# Patient Record
Sex: Female | Born: 1980 | Race: White | Hispanic: No | Marital: Single | Smoking: Current every day smoker
Health system: Southern US, Community
[De-identification: ages and names within clinical notes are randomized; demographics above are authoritative.]

## PROBLEM LIST (undated history)

## (undated) ENCOUNTER — Inpatient Hospital Stay (HOSPITAL_COMMUNITY): Payer: Self-pay

## (undated) DIAGNOSIS — F329 Major depressive disorder, single episode, unspecified: Secondary | ICD-10-CM

## (undated) DIAGNOSIS — O30049 Twin pregnancy, dichorionic/diamniotic, unspecified trimester: Secondary | ICD-10-CM

## (undated) DIAGNOSIS — R87619 Unspecified abnormal cytological findings in specimens from cervix uteri: Secondary | ICD-10-CM

## (undated) DIAGNOSIS — F32A Depression, unspecified: Secondary | ICD-10-CM

## (undated) DIAGNOSIS — I10 Essential (primary) hypertension: Secondary | ICD-10-CM

## (undated) DIAGNOSIS — Z720 Tobacco use: Secondary | ICD-10-CM

## (undated) DIAGNOSIS — J449 Chronic obstructive pulmonary disease, unspecified: Secondary | ICD-10-CM

## (undated) DIAGNOSIS — F419 Anxiety disorder, unspecified: Secondary | ICD-10-CM

## (undated) DIAGNOSIS — Z8709 Personal history of other diseases of the respiratory system: Secondary | ICD-10-CM

## (undated) DIAGNOSIS — IMO0002 Reserved for concepts with insufficient information to code with codable children: Secondary | ICD-10-CM

## (undated) HISTORY — PX: BREAST BIOPSY: SHX20

## (undated) HISTORY — PX: CRYOTHERAPY: SHX1416

## (undated) HISTORY — PX: CHOLECYSTECTOMY: SHX55

## (undated) HISTORY — PX: CLEFT LIP REPAIR: SHX5225

## (undated) HISTORY — PX: RHINOPLASTY FOR CLEFT LIP / PALATE: SUR1285

## (undated) HISTORY — PX: WISDOM TOOTH EXTRACTION: SHX21

## (undated) HISTORY — PX: COLPOSCOPY: SHX161

---

## 2012-08-11 NOTE — L&D Delivery Note (Signed)
Delivery Note At 6:16 AM a healthy female was delivered via Vaginal, Spontaneous Delivery (Presentation: ; Occiput Anterior).  APGAR:8 ,9 ; weight pending .   Placenta status: delivered spontaneously--looked like small infarcts, sent to pathology, .  Cord: 3 vessels with the following complications: Nuchal x 1 reduced    Anesthesia: Epidural  Episiotomy: None Lacerations: small right labial Suture Repair: 3.0 vicryl in two interrupted sutures Est. Blood Loss (mL): 350cc  Mom to postpartum.  Baby to stay with mother.  Oliver Pila 10/19/2012, 6:33 AM

## 2012-10-18 ENCOUNTER — Inpatient Hospital Stay (HOSPITAL_COMMUNITY)
Admission: AD | Admit: 2012-10-18 | Discharge: 2012-10-20 | DRG: 775 | Disposition: A | Discharge status: Home / Self Care | Payer: Medicaid Other | Source: Ambulatory Visit | Attending: Obstetrics and Gynecology | Admitting: Obstetrics and Gynecology

## 2012-10-18 ENCOUNTER — Encounter (HOSPITAL_COMMUNITY): Payer: Self-pay

## 2012-10-18 DIAGNOSIS — O99334 Smoking (tobacco) complicating childbirth: Secondary | ICD-10-CM | POA: Diagnosis present

## 2012-10-18 DIAGNOSIS — O4290 Premature rupture of membranes, unspecified as to length of time between rupture and onset of labor, unspecified weeks of gestation: Secondary | ICD-10-CM

## 2012-10-18 HISTORY — DX: Anxiety disorder, unspecified: F41.9

## 2012-10-18 HISTORY — DX: Reserved for concepts with insufficient information to code with codable children: IMO0002

## 2012-10-18 HISTORY — DX: Unspecified abnormal cytological findings in specimens from cervix uteri: R87.619

## 2012-10-18 LAB — OB RESULTS CONSOLE RPR: RPR: NONREACTIVE

## 2012-10-18 LAB — COMPREHENSIVE METABOLIC PANEL
ALT: 8 U/L (ref 0–35)
Alkaline Phosphatase: 113 U/L (ref 39–117)
BUN: 5 mg/dL — ABNORMAL LOW (ref 6–23)
BUN: 5 mg/dL — ABNORMAL LOW (ref 6–23)
CO2: 21 mEq/L (ref 19–32)
CO2: 20 mEq/L (ref 19–32)
Calcium: 9.3 mg/dL (ref 8.4–10.5)
Chloride: 101 mEq/L (ref 96–112)
Creatinine, Ser: 0.73 mg/dL (ref 0.50–1.10)
GFR calc Af Amer: >90 mL/min (ref >90)
GFR calc Af Amer: >90 mL/min (ref >90)
GFR calc non Af Amer: >90 mL/min (ref >90)
GFR calc non Af Amer: >90 mL/min (ref >90)
Glucose, Bld: 78 mg/dL (ref 70–99)
Glucose, Bld: 78 mg/dL (ref 70–99)
Potassium: 3.6 mEq/L (ref 3.5–5.1)
Sodium: 134 mEq/L — ABNORMAL LOW (ref 135–145)
Sodium: 134 mEq/L — ABNORMAL LOW (ref 135–145)
Total Bilirubin: 0.2 mg/dL — ABNORMAL LOW (ref 0.3–1.2)
Total Protein: 6.8 g/dL (ref 6.0–8.3)

## 2012-10-18 LAB — LACTATE DEHYDROGENASE: LDH: 160 U/L (ref 94–250)

## 2012-10-18 LAB — OB RESULTS CONSOLE HIV ANTIBODY (ROUTINE TESTING)
HIV: NONREACTIVE
HIV: NONREACTIVE

## 2012-10-18 LAB — CBC
HCT: 32.4 % — ABNORMAL LOW (ref 36.0–46.0)
Hemoglobin: 10.9 g/dL — ABNORMAL LOW (ref 12.0–15.0)
MCV: 88.5 fL (ref 78.0–100.0)
RBC: 3.66 MIL/uL — ABNORMAL LOW (ref 3.87–5.11)
RDW: 13.3 % (ref 11.5–15.5)
WBC: 17.1 10*3/uL — ABNORMAL HIGH (ref 4.0–10.5)

## 2012-10-18 LAB — OB RESULTS CONSOLE RUBELLA ANTIBODY, IGM: Rubella: IMMUNE

## 2012-10-18 LAB — OB RESULTS CONSOLE GC/CHLAMYDIA
Chlamydia: NEGATIVE
Gonorrhea: NEGATIVE

## 2012-10-18 LAB — URIC ACID: Uric Acid, Serum: 4 mg/dL (ref 2.4–7.0)

## 2012-10-18 LAB — OB RESULTS CONSOLE ABO/RH

## 2012-10-18 LAB — OB RESULTS CONSOLE GBS: GBS: NEGATIVE

## 2012-10-18 MED ORDER — BUTORPHANOL TARTRATE 1 MG/ML IJ SOLN
1.0000 mg | INTRAMUSCULAR | Status: DC | PRN
  Administered 2012-10-18 – 2012-10-19 (×2): 1 mg via INTRAVENOUS
  Filled 2012-10-18 (×2): qty 1

## 2012-10-18 MED ORDER — CITRIC ACID-SODIUM CITRATE 334-500 MG/5ML PO SOLN: 30.0000 mL | ORAL | Status: DC | PRN

## 2012-10-18 MED ORDER — PHENYLEPHRINE 40 MCG/ML (10ML) SYRINGE FOR IV PUSH (FOR BLOOD PRESSURE SUPPORT): 80.0000 ug | PREFILLED_SYRINGE | INTRAVENOUS | Status: DC | PRN

## 2012-10-18 MED ORDER — DIPHENHYDRAMINE HCL 50 MG/ML IJ SOLN: 12.5000 mg | INTRAMUSCULAR | Status: DC | PRN

## 2012-10-18 MED ORDER — PENICILLIN G POTASSIUM 5000000 UNITS IJ SOLR
5.0000 10*6.[IU] | Freq: Once | INTRAMUSCULAR | Status: DC
  Filled 2012-10-18: qty 5

## 2012-10-18 MED ORDER — OXYCODONE-ACETAMINOPHEN 5-325 MG PO TABS: 1.0000 | ORAL_TABLET | ORAL | Status: DC | PRN

## 2012-10-18 MED ORDER — TERBUTALINE SULFATE 1 MG/ML IJ SOLN: 0.2500 mg | Freq: Once | INTRAMUSCULAR | Status: AC | PRN

## 2012-10-18 MED ORDER — OXYTOCIN 40 UNITS IN LACTATED RINGERS INFUSION - SIMPLE MED: 62.5000 mL/h | INTRAVENOUS | Status: DC

## 2012-10-18 MED ORDER — EPHEDRINE 5 MG/ML INJ: 10.0000 mg | INTRAVENOUS | Status: DC | PRN

## 2012-10-18 MED ORDER — ACETAMINOPHEN 325 MG PO TABS: 650.0000 mg | ORAL_TABLET | ORAL | Status: DC | PRN

## 2012-10-18 MED ORDER — EPHEDRINE 5 MG/ML INJ
10.0000 mg | INTRAVENOUS | Status: DC | PRN
  Filled 2012-10-18: qty 4

## 2012-10-18 MED ORDER — PHENYLEPHRINE 40 MCG/ML (10ML) SYRINGE FOR IV PUSH (FOR BLOOD PRESSURE SUPPORT)
80.0000 ug | PREFILLED_SYRINGE | INTRAVENOUS | Status: DC | PRN
  Filled 2012-10-18: qty 5

## 2012-10-18 MED ORDER — PROMETHAZINE HCL 25 MG/ML IJ SOLN
12.5000 mg | Freq: Four times a day (QID) | INTRAMUSCULAR | Status: DC | PRN
  Administered 2012-10-18: 12.5 mg via INTRAVENOUS
  Filled 2012-10-18: qty 1

## 2012-10-18 MED ORDER — IBUPROFEN 600 MG PO TABS: 600.0000 mg | ORAL_TABLET | Freq: Four times a day (QID) | ORAL | Status: DC | PRN

## 2012-10-18 MED ORDER — PENICILLIN G POTASSIUM 5000000 UNITS IJ SOLR
2.5000 10*6.[IU] | INTRAVENOUS | Status: DC
  Filled 2012-10-18 (×4): qty 2.5

## 2012-10-18 MED ORDER — LIDOCAINE HCL (PF) 1 % IJ SOLN
30.0000 mL | INTRAMUSCULAR | Status: DC | PRN
  Filled 2012-10-18: qty 30

## 2012-10-18 MED ORDER — LACTATED RINGERS IV SOLN: 500.0000 mL | INTRAVENOUS | Status: DC | PRN

## 2012-10-18 MED ORDER — LACTATED RINGERS IV SOLN
INTRAVENOUS | Status: DC
  Administered 2012-10-18 – 2012-10-19 (×3): via INTRAVENOUS

## 2012-10-18 MED ORDER — OXYTOCIN BOLUS FROM INFUSION
500.0000 mL | INTRAVENOUS | Status: DC
  Administered 2012-10-19: 500 mL via INTRAVENOUS

## 2012-10-18 MED ORDER — FENTANYL 2.5 MCG/ML BUPIVACAINE 1/10 % EPIDURAL INFUSION (WH - ANES)
14.0000 mL/h | INTRAMUSCULAR | Status: DC | PRN
  Filled 2012-10-18: qty 125

## 2012-10-18 MED ORDER — OXYTOCIN 40 UNITS IN LACTATED RINGERS INFUSION - SIMPLE MED
1.0000 m[IU]/min | INTRAVENOUS | Status: DC
  Administered 2012-10-18: 2 m[IU]/min via INTRAVENOUS
  Filled 2012-10-18: qty 1000

## 2012-10-18 MED ORDER — ONDANSETRON HCL 4 MG/2ML IJ SOLN: 4.0000 mg | Freq: Four times a day (QID) | INTRAMUSCULAR | Status: DC | PRN

## 2012-10-18 MED ORDER — LACTATED RINGERS IV SOLN
500.0000 mL | Freq: Once | INTRAVENOUS | Status: AC
  Administered 2012-10-19: 500 mL via INTRAVENOUS

## 2012-10-18 NOTE — MAU Note (Signed)
Patient states she started leaking bloody fluid at 0730. Reports good fetal movement and not sure if having contractions. Having no pain.

## 2012-10-18 NOTE — MAU Note (Signed)
Dr. Senaida Ores returned page. Notified pt r/o rupture, bloody amniotic fluid, fern +, cervix 1/70/-2, GBS not in prenatal record. Bp's elevated. Dr. Senaida Ores to put in orders for admission, etc.

## 2012-10-18 NOTE — MAU Note (Signed)
Pt states leaking of fluid began this am, denies pain or contractions. Has not had vaginal exam nor GBS swab.

## 2012-10-18 NOTE — Progress Notes (Signed)
Patient ID: Robin Valenzuela, female   DOB: 1980-09-26, 32 y.o.   MRN: [id number] Pt just starting to get a bit uncomfortable.  Coping for now FHR reassuring, no decels accels and variability present  Cervix 80-90/1/-1  Bleeding has improved quite a bit, just slightly blood tinged fluid Pt in latent phase labor, pitocin at 16mu  BP borderline 130-140/80-90  Will check PIH labs platelets WNL.

## 2012-10-18 NOTE — Progress Notes (Signed)
   Subjective: Pt feeling no pain or ctx.  +LOF concerned over bloody nature  Objective: BP 143/84  Pulse 82  Temp(Src) 97.6 F (36.4 C) (Oral)  Resp 18  Ht 5\' 8"  (1.727 m)  Wt 85.276 kg (188 lb)  BMI 28.59 kg/m2  SpO2 99%      FHT:  FHR: 135 bpm, variability: moderate,  accelerations:  Present,  decelerations:  Absent UC:   none SVE:   Dilation: Fingertip Effacement (%): 80 Station: -2 Exam by:: Dr. Senaida Ores Fluid does appear to have more blood than usual and a few clots  Labs: Lab Results  Component Value Date   WBC 17.1* 10/18/2012   HGB 10.9* 10/18/2012   HCT 32.4* 10/18/2012   MCV 88.5 10/18/2012   PLT 379 10/18/2012    Assessment / Plan: Pt with SROM and no onset of labor.  FHR currently reassuring.  D/w pt and family possible small abruption causing bleeding and that we will be watching FHR very closely to see if tolerates labor. Robin Valenzuela 10/18/2012, 1:42 PM

## 2012-10-18 NOTE — H&P (Signed)
Robin Valenzuela is a 32 y.o. female G1P0 presenting for at [redacted] weeks gestation(EDD 11/05/12 by 9 week [place]) with SROM about 0730am.  Her prenatal care is significant for a positive smoker status and a history of anxiety which is stable on Celexa.  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  The baby was S<D but an [place] 2/18 showed the baby at the 34%ile.    Maternal Medical History:  Reason for admission: Rupture of membranes.   Contractions: Onset was 3-5 hours ago.   Frequency: irregular.   Perceived severity is mild.    Fetal activity: Perceived fetal activity is normal.    Prenatal Complications - Diabetes: none.    OB History   Grav Para Term Preterm Abortions TAB SAB Ect Mult Living   1              Past Medical History  Diagnosis Date  . Abnormal Pap smear   . Anxiety    Past Surgical History  Procedure Laterality Date  . Cholecystectomy    . Rhinoplasty for cleft lip / palate    . Cryotherapy    . Colposcopy    . Wisdom tooth extraction     Family History: family history is not on file. Social History:  reports that she has been smoking Cigarettes.  She has been smoking about 0.50 packs per day. She has never used smokeless tobacco. She reports that she does not drink alcohol or use illicit drugs.   Prenatal Transfer Tool  Maternal Diabetes: No Genetic Screening: Normal Maternal Ultrasounds/Referrals: Normal Fetal Ultrasounds or other Referrals:  None Maternal Substance Abuse:  Yes:  Type: Smoker Significant Maternal Medications:  Meds include: Other: Celexa Significant Maternal Lab Results:  Lab values include: Other: GBS unknown on admission Other Comments:  None  ROS  Dilation: 1 Effacement (%): 70 Station: -2 Exam by:: SBeck, RN Blood pressure 146/88, pulse 88, temperature 98.7 F (37.1 C), temperature source Oral, resp. rate 18, height 5\' 8"  (1.727 m), weight 85.367 kg (188 lb 3.2 oz), SpO2 99.00%. Maternal Exam:  Uterine Assessment:  Contraction strength is mild.  Contraction frequency is irregular.   Abdomen: Patient reports no abdominal tenderness. Fetal presentation: vertex  Introitus: Normal vulva. Normal vagina.  Ferning test: positive.  Amniotic fluid character: bloody.  Pelvis: adequate for delivery.      Physical Exam  Constitutional: She is oriented to person, place, and time. She appears well-developed and well-nourished.  Cardiovascular: Normal rate and regular rhythm.   Respiratory: Effort normal and breath sounds normal.  GI: Soft.  Neurological: She is alert and oriented to person, place, and time.  Psychiatric: She has a normal mood and affect. Her behavior is normal.    Prenatal labs: ABO, Rh: A/Positive/-- (03/10 0000) Antibody: Negative (03/10 0000) Rubella: Immune (03/10 0000) RPR: Nonreactive (03/10 0000)  HBsAg: Negative (03/10 0000)  HIV: Non-reactive (03/10 0000)  GBS:   unknown--missed appt to have done First trimester screen and AFP WNL One hour GTT 95  Assessment/Plan: Pt admitted with SROM and no real labor.  Will admit and augment with pitocin.  GBS status unknown as pt missed her appt last week.  We will send a rapid GBS cx and treat for now.   Oliver Pila 10/18/2012, 12:29 PM

## 2012-10-19 ENCOUNTER — Encounter (HOSPITAL_COMMUNITY): Payer: Self-pay | Admitting: Anesthesiology

## 2012-10-19 ENCOUNTER — Encounter (HOSPITAL_COMMUNITY): Payer: Self-pay | Admitting: Family Medicine

## 2012-10-19 DIAGNOSIS — O4290 Premature rupture of membranes, unspecified as to length of time between rupture and onset of labor, unspecified weeks of gestation: Secondary | ICD-10-CM

## 2012-10-19 MED ORDER — IBUPROFEN 600 MG PO TABS
600.0000 mg | ORAL_TABLET | Freq: Four times a day (QID) | ORAL | Status: DC
  Administered 2012-10-19 – 2012-10-20 (×4): 600 mg via ORAL
  Filled 2012-10-19 (×6): qty 1

## 2012-10-19 MED ORDER — DIBUCAINE 1 % RE OINT: 1.0000 "application " | TOPICAL_OINTMENT | RECTAL | Status: DC | PRN

## 2012-10-19 MED ORDER — ZOLPIDEM TARTRATE 5 MG PO TABS: 5.0000 mg | ORAL_TABLET | Freq: Every evening | ORAL | Status: DC | PRN

## 2012-10-19 MED ORDER — ONDANSETRON HCL 4 MG/2ML IJ SOLN: 4.0000 mg | INTRAMUSCULAR | Status: DC | PRN

## 2012-10-19 MED ORDER — BENZOCAINE-MENTHOL 20-0.5 % EX AERO
1.0000 "application " | INHALATION_SPRAY | CUTANEOUS | Status: DC | PRN
  Administered 2012-10-19: 1 via TOPICAL
  Filled 2012-10-19: qty 56

## 2012-10-19 MED ORDER — OXYCODONE-ACETAMINOPHEN 5-325 MG PO TABS: 1.0000 | ORAL_TABLET | ORAL | Status: DC | PRN

## 2012-10-19 MED ORDER — SIMETHICONE 80 MG PO CHEW: 80.0000 mg | CHEWABLE_TABLET | ORAL | Status: DC | PRN

## 2012-10-19 MED ORDER — LANOLIN HYDROUS EX OINT: TOPICAL_OINTMENT | CUTANEOUS | Status: DC | PRN

## 2012-10-19 MED ORDER — WITCH HAZEL-GLYCERIN EX PADS: 1.0000 "application " | MEDICATED_PAD | CUTANEOUS | Status: DC | PRN

## 2012-10-19 MED ORDER — ONDANSETRON HCL 4 MG PO TABS: 4.0000 mg | ORAL_TABLET | ORAL | Status: DC | PRN

## 2012-10-19 MED ORDER — PRENATAL MULTIVITAMIN CH
1.0000 | ORAL_TABLET | Freq: Every day | ORAL | Status: DC
  Administered 2012-10-19: 1 via ORAL
  Filled 2012-10-19: qty 1

## 2012-10-19 MED ORDER — DIPHENHYDRAMINE HCL 25 MG PO CAPS: 25.0000 mg | ORAL_CAPSULE | Freq: Four times a day (QID) | ORAL | Status: DC | PRN

## 2012-10-19 MED ORDER — SENNOSIDES-DOCUSATE SODIUM 8.6-50 MG PO TABS
2.0000 | ORAL_TABLET | Freq: Every day | ORAL | Status: DC
  Administered 2012-10-19: 2 via ORAL

## 2012-10-19 MED ORDER — TETANUS-DIPHTH-ACELL PERTUSSIS 5-2.5-18.5 LF-MCG/0.5 IM SUSP: 0.5000 mL | Freq: Once | INTRAMUSCULAR | Status: DC

## 2012-10-19 NOTE — Progress Notes (Signed)
Post Partum Day 0 Subjective: no complaints, up ad lib and tolerating PO  Objective: Blood pressure 154/78, pulse 82, temperature 98.9 F (37.2 C), temperature source Oral, resp. rate 18, height 5\' 8"  (1.727 m), weight 85.276 kg (188 lb), SpO2 98.00%, unknown if currently breastfeeding.  Physical Exam:  General: alert and cooperative Lochia: appropriate Uterine Fundus: firm    Recent Labs  10/18/12 1245  HGB 10.9*  HCT 32.4*    Assessment/Plan: Breastfeeding Pt BP still elevated.  PIH labs WNL in labor.  Will follow BP to see if normalizes throughout day.   LOS: 1 day   RICHARDSON,KATHY W 10/19/2012, 8:52 AM

## 2012-10-19 NOTE — Discharge Summary (Signed)
Obstetric Discharge Summary Reason for Admission: rupture of membranes Prenatal Procedures: none Intrapartum Procedures: spontaneous vaginal delivery Postpartum Procedures: none Complications-Operative and Postpartum: small right labial laceration Hemoglobin  Date Value Range Status  10/20/2012 8.4* 12.0 - 15.0 g/dL Final     DELTA CHECK NOTED     REPEATED TO VERIFY     HCT  Date Value Range Status  10/20/2012 24.9* 36.0 - 46.0 % Final    Physical Exam:  General: alert and cooperative Lochia: appropriate Uterine Fundus: firm  Discharge Diagnoses: Term Pregnancy-delivered  Discharge Information: Date: 10/20/2012 Activity: pelvic rest Diet: routine Medications: Ibuprofen and Percocet Condition: improved Instructions: refer to practice specific booklet Discharge to: home Follow-up Information   Follow up with Oliver Pila, MD. Schedule an appointment as soon as possible for a visit in 6 weeks.   Contact information:   [address], [address] [place] [address] [phone]       Newborn Data: Live born female  Birth Weight: 6 lb 0.5 oz (2735 g) APGAR: 8, 9  Home with mother.  Oliver Pila 10/20/2012, 8:28 AM

## 2012-10-19 NOTE — Anesthesia Preprocedure Evaluation (Signed)

## 2012-10-19 NOTE — Progress Notes (Signed)
Epidural catheter pulled at 07:12 per TO by Dr. Cristela Blue. Catheter intact.

## 2012-10-19 NOTE — Anesthesia Postprocedure Evaluation (Signed)
  Anesthesia Post-op Note  Patient: Robin Valenzuela  Procedure(s) Performed: * No procedures listed *  Patient Location: Mother/Baby  Anesthesia Type:Epidural  Level of Consciousness: awake, alert , oriented and patient cooperative  Airway and Oxygen Therapy: Patient Spontanous Breathing  Post-op Pain: mild  Post-op Assessment: Patient's Cardiovascular Status Stable, Respiratory Function Stable, Patent Airway, No signs of Nausea or vomiting, Adequate PO intake and Pain level controlled  Post-op Vital Signs: Reviewed and stable  Complications: No apparent anesthesia complications

## 2012-10-20 LAB — CBC
HCT: 24.9 % — ABNORMAL LOW (ref 36.0–46.0)
Hemoglobin: 8.4 g/dL — ABNORMAL LOW (ref 12.0–15.0)
MCH: 29.9 pg (ref 26.0–34.0)
RBC: 2.81 MIL/uL — ABNORMAL LOW (ref 3.87–5.11)

## 2012-10-20 MED ORDER — OXYCODONE-ACETAMINOPHEN 5-325 MG PO TABS: 1.0000 | ORAL_TABLET | ORAL | Status: DC | PRN

## 2012-10-20 MED ORDER — IBUPROFEN 600 MG PO TABS: 600.0000 mg | ORAL_TABLET | Freq: Four times a day (QID) | ORAL | Status: DC

## 2012-10-20 NOTE — Progress Notes (Signed)
Pt discharged before CSW could assess history of anxiety. 

## 2012-10-20 NOTE — Progress Notes (Signed)
Post Partum Day 1 Subjective: no complaints, tolerating PO and + flatus Requests early d/c  Objective: Blood pressure 153/94, pulse 74, temperature 98.3 F (36.8 C), temperature source Oral, resp. rate 18, height 5\' 8"  (1.727 m), weight 85.276 kg (188 lb), SpO2 100.00%, unknown if currently breastfeeding.  Physical Exam:  General: alert and cooperative Lochia: appropriate Uterine Fundus: firm    Recent Labs  10/18/12 1245 10/20/12 0635  HGB 10.9* 8.4*  HCT 32.4* 24.9*    Assessment/Plan: Plan for discharge tomorrow if baby able to go Motrin and percocet   LOS: 2 days   RICHARDSON,KATHY W 10/20/2012, 8:26 AM

## 2012-10-27 ENCOUNTER — Telehealth (HOSPITAL_COMMUNITY): Payer: Self-pay | Admitting: *Deleted

## 2012-10-27 NOTE — Telephone Encounter (Signed)
Resolve episode 

## 2012-11-22 ENCOUNTER — Inpatient Hospital Stay (HOSPITAL_COMMUNITY): Admission: AD | Admit: 2012-11-22 | Payer: Self-pay | Source: Ambulatory Visit | Admitting: Obstetrics and Gynecology

## 2014-06-12 ENCOUNTER — Encounter (HOSPITAL_COMMUNITY): Payer: Self-pay | Admitting: Family Medicine

## 2015-03-12 DIAGNOSIS — O30049 Twin pregnancy, dichorionic/diamniotic, unspecified trimester: Secondary | ICD-10-CM

## 2015-03-12 HISTORY — DX: Twin pregnancy, dichorionic/diamniotic, unspecified trimester: O30.049

## 2015-04-04 LAB — OB RESULTS CONSOLE GC/CHLAMYDIA
CHLAMYDIA, DNA PROBE: NEGATIVE
Gonorrhea: NEGATIVE

## 2015-04-06 ENCOUNTER — Encounter (HOSPITAL_COMMUNITY): Payer: Self-pay | Admitting: Obstetrics and Gynecology

## 2015-04-06 ENCOUNTER — Other Ambulatory Visit (HOSPITAL_COMMUNITY): Payer: Self-pay | Admitting: Obstetrics and Gynecology

## 2015-04-06 DIAGNOSIS — O358XX2 Maternal care for other (suspected) fetal abnormality and damage, fetus 2: Secondary | ICD-10-CM

## 2015-04-06 DIAGNOSIS — Z3A13 13 weeks gestation of pregnancy: Secondary | ICD-10-CM

## 2015-04-06 DIAGNOSIS — IMO0001 Reserved for inherently not codable concepts without codable children: Secondary | ICD-10-CM

## 2015-04-06 DIAGNOSIS — Z3682 Encounter for antenatal screening for nuchal translucency: Secondary | ICD-10-CM

## 2015-04-06 DIAGNOSIS — O30049 Twin pregnancy, dichorionic/diamniotic, unspecified trimester: Secondary | ICD-10-CM

## 2015-04-06 DIAGNOSIS — O358XX1 Maternal care for other (suspected) fetal abnormality and damage, fetus 1: Secondary | ICD-10-CM

## 2015-04-13 ENCOUNTER — Encounter (HOSPITAL_COMMUNITY): Payer: Self-pay

## 2015-04-13 ENCOUNTER — Ambulatory Visit (HOSPITAL_COMMUNITY)
Admission: RE | Admit: 2015-04-13 | Discharge: 2015-04-13 | Disposition: A | Discharge status: Home / Self Care | Payer: Medicaid Other | Source: Ambulatory Visit | Attending: Obstetrics and Gynecology | Admitting: Obstetrics and Gynecology

## 2015-04-13 ENCOUNTER — Other Ambulatory Visit (HOSPITAL_COMMUNITY): Payer: Self-pay | Admitting: Obstetrics and Gynecology

## 2015-04-13 DIAGNOSIS — O358XX2 Maternal care for other (suspected) fetal abnormality and damage, fetus 2: Secondary | ICD-10-CM

## 2015-04-13 DIAGNOSIS — Z3A12 12 weeks gestation of pregnancy: Secondary | ICD-10-CM

## 2015-04-13 DIAGNOSIS — O30042 Twin pregnancy, dichorionic/diamniotic, second trimester: Secondary | ICD-10-CM

## 2015-04-13 DIAGNOSIS — O30041 Twin pregnancy, dichorionic/diamniotic, first trimester: Secondary | ICD-10-CM | POA: Diagnosis not present

## 2015-04-13 DIAGNOSIS — IMO0001 Reserved for inherently not codable concepts without codable children: Secondary | ICD-10-CM

## 2015-04-13 DIAGNOSIS — O283 Abnormal ultrasonic finding on antenatal screening of mother: Secondary | ICD-10-CM

## 2015-04-13 DIAGNOSIS — O358XX1 Maternal care for other (suspected) fetal abnormality and damage, fetus 1: Secondary | ICD-10-CM

## 2015-04-13 DIAGNOSIS — O30049 Twin pregnancy, dichorionic/diamniotic, unspecified trimester: Secondary | ICD-10-CM

## 2015-04-13 DIAGNOSIS — Z3682 Encounter for antenatal screening for nuchal translucency: Secondary | ICD-10-CM

## 2015-04-13 DIAGNOSIS — Z3A13 13 weeks gestation of pregnancy: Secondary | ICD-10-CM

## 2015-04-13 DIAGNOSIS — Z315 Encounter for genetic counseling: Secondary | ICD-10-CM | POA: Diagnosis not present

## 2015-04-13 HISTORY — DX: Twin pregnancy, dichorionic/diamniotic, unspecified trimester: O30.049

## 2015-04-13 NOTE — Progress Notes (Addendum)
Genetic Counseling  High-Risk Gestation Note  Appointment Date:  04/13/2015 Referred By: Huel Cote, MD Date of Birth:  1980/12/04   Pregnancy History: G2P1001 Estimated Date of Delivery: 10/26/15 Estimated Gestational Age: [redacted]w[redacted]d Attending: Particia Nearing, MD    Robin Valenzuela was seen for genetic counseling because of increased nuchal translucency on ultrasound in a dichorionic/diamniotic twin gestation. The patient was accompanied by her mother to today's appointment.   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 Twin B had an NT measurement of 1.9 mm, which is within normal range. Complete ultrasound results reported under separate cover.   We discussed that the fetal NT refers to a fluid filled space between the skin and soft tissues behind the cervical spine.  This space is traditionally measurable between 11 and 13.[redacted] weeks gestation and is considered enlarged at ~3 mm or greater. Robin Valenzuela was counseled regarding the various common etiologies for an enlarged NT including: aneuploidy, single gene conditions, cardiac or great vessel abnormalities, lymphatic system failure, decreased fetal movement, and fetal anemia.    We reviewed chromosomes, nondisjunction, and the common features and prognoses of Down syndrome and other aneuploidies.  Considering  Robin Valenzuela's age, an NT of 3.7 and a CRL of 52 mm, the risk for fetal aneuploidy is estimated to be approximately 25%.   In addition, we discussed that an NT of 3.7 is associated with an approximate 3% risk for a fetal cardiac anomaly.  We also discussed single gene conditions and that these conditions are not routinely tested for prenatally unless ultrasound findings or family history significantly increase the suspicion of a specific single gene disorder.  We briefly reviewed common inheritance patterns (dominant, recessive, and X-linked) as well as the associated risks of recurrence.    She was then counseled regarding the screening options of noninvasive prenatal screening (NIPS)/cell free DNA testing, fetal echocardiogram, and detailed ultrasound in the second trimester.   We reviewed the benefits and limitations of these options.  Regarding NIPS, we discussed the conditions for which it assesses, the methodology, detection rates, and false positive rates. We reviewed that the sensitivity and specificity are expected to be lower in a twin gestation compared to a singleton. She understands that this does not assess for all chromosome conditions and does not assess for single gene conditions. We also discussed that NIPS is not able to provide separate risk assessment for each twin but rather for the pregnancy as a whole. We also discussed the diagnostic testing options of chorionic villus sampling (CVS) and amniocentesis. We discussed the approximate 1 in 100 risk for complications for CVS and the approximate 1 in 300-500 risk for complications for amniocentesis, including the chance for spontaneous pregnancy loss.   After thoughtful consideration, she elected to proceed with NIPS today ([facility] through [facility]).  She also expressed interest in returning for detailed ultrasound and possible amniocentesis on that same date. Ultrasound is scheduled for 05/25/15.  The patient is also interested in fetal  echocardiogram, which we can help facilitate.   Both family histories were reviewed and found to be contributory for history of left cleft lip for the patient. She had surgical corrections in childhood through [facility] and reported no additional congenital anomalies, dysmorphic features, or medical concerns. We discussed that cleft lip +/- cleft palate can be syndromic or isolated.  If she has a syndromic form of clefting, the chance of having an affected child depends on the inheritance pattern of that condition.  If she has an isolated form of clefting, we discussed the probable multifactorial inheritance and explained that genetic testing for isolated cleft lip +/- cleft palate is not currently available.  Based on the family history, the chance to have a baby with an isolated cleft lip +/- cleft palate is approximately 4%. We reviewed the benefits and limitations of detailed ultrasound in the second trimester to assess for orofacial clefting. Without further information regarding the provided family history, an accurate genetic risk cannot be calculated. Further genetic counseling is warranted if more information is obtained.  Robin Valenzuela denied exposure to environmental toxins. She reported currently taking Zoloft. She was taking Paxil prior to being aware of the pregnancy, and her physician changed her medication to zoloft at approximately [redacted] weeks gestation, when she discovered the pregnancy. Based on available study data of use on Zoloft during human pregnancy, no apparent association with increased risk of birth defects is suggested. Use of Zoloft and other serotonin reuptake inhibitors late in gestation has been associated with an increased risk of mild transient neonatal syndrome of central nervous system including irritability, vomiting, jitteriness and convulsions, as well as neonatal pulmonary hypertension. These associated symptoms typically do not appear to occur  frequently or severely. The all-or-none period was discussed, meaning exposures that occur in the first 4 weeks of gestation are typically thought to either not affect the pregnancy at all or result in a miscarriage. Available data have reported conflicting evidence regarding whether or not prenatal exposure to Paxil increases the risk for congenital heart defects. The reported exposure to Paxil likely occurred during the initial [redacted] weeks gestation, in which case the risk for possible associated effects is not likely increased.  She denied the use of alcohol or street drugs. She reported smoking approximately 10 cigarettes per day. The associations of smoking in pregnancy were reviewed and cessation encouraged. She denied significant viral illnesses during the course of her pregnancy. Her medical and surgical histories were contributory for cleft lip repair.   I counseled Robin Valenzuela regarding the above risks and available options.  The approximate face-to-face time with the genetic counselor was 45 minutes.    Quinn Plowman, MS Certified Genetic Counselor 04/13/2015

## 2015-04-23 ENCOUNTER — Encounter (HOSPITAL_COMMUNITY): Payer: Self-pay | Admitting: Obstetrics and Gynecology

## 2015-04-23 ENCOUNTER — Other Ambulatory Visit (HOSPITAL_COMMUNITY): Payer: Self-pay | Admitting: Obstetrics and Gynecology

## 2015-04-24 ENCOUNTER — Telehealth (HOSPITAL_COMMUNITY): Payer: Self-pay

## 2015-04-24 NOTE — Telephone Encounter (Signed)
Called Robin Valenzuela to discuss her cell free fetal DNA test results.  Ms. DINISHA CAI had [facility] testing through [facility].  Testing was offered because of an abnormal NT measurement for baby A.   The patient was identified by name and DOB.  We reviewed that these are within normal limits, showing a less than 1 in 10,000 risk for trisomies 21, 18 and 13.  We reviewed the detection and false positive rates for this testing in a twin gestation. She understands that this testing does not identify all genetic conditions.  All questions were answered to her satisfaction, she was encouraged to call with additional questions or concerns.  Donald Prose, MS Certified Genetic Counselor

## 2015-04-25 ENCOUNTER — Ambulatory Visit (HOSPITAL_COMMUNITY): Payer: Medicaid Other

## 2015-04-25 ENCOUNTER — Encounter (HOSPITAL_COMMUNITY): Payer: Medicaid Other

## 2015-04-25 ENCOUNTER — Other Ambulatory Visit (HOSPITAL_COMMUNITY): Payer: Medicaid Other

## 2015-04-25 ENCOUNTER — Other Ambulatory Visit (HOSPITAL_COMMUNITY): Payer: Self-pay | Admitting: Obstetrics and Gynecology

## 2015-05-25 ENCOUNTER — Other Ambulatory Visit (HOSPITAL_COMMUNITY): Payer: Self-pay | Admitting: Maternal and Fetal Medicine

## 2015-05-25 ENCOUNTER — Ambulatory Visit (HOSPITAL_COMMUNITY)
Admission: RE | Admit: 2015-05-25 | Discharge: 2015-05-25 | Disposition: A | Discharge status: Home / Self Care | Payer: Medicaid Other | Source: Ambulatory Visit | Attending: Obstetrics and Gynecology | Admitting: Obstetrics and Gynecology

## 2015-05-25 VITALS — BP 137/84 | HR 85 | Wt 153.6 lb

## 2015-05-25 DIAGNOSIS — Z8279 Family history of other congenital malformations, deformations and chromosomal abnormalities: Secondary | ICD-10-CM

## 2015-05-25 DIAGNOSIS — O358XX9 Maternal care for other (suspected) fetal abnormality and damage, other fetus: Secondary | ICD-10-CM

## 2015-05-25 DIAGNOSIS — Z3689 Encounter for other specified antenatal screening: Secondary | ICD-10-CM

## 2015-05-25 DIAGNOSIS — Z36 Encounter for antenatal screening of mother: Secondary | ICD-10-CM | POA: Diagnosis not present

## 2015-05-25 DIAGNOSIS — O283 Abnormal ultrasonic finding on antenatal screening of mother: Secondary | ICD-10-CM

## 2015-05-25 DIAGNOSIS — Z3A18 18 weeks gestation of pregnancy: Secondary | ICD-10-CM | POA: Diagnosis not present

## 2015-05-25 DIAGNOSIS — O30042 Twin pregnancy, dichorionic/diamniotic, second trimester: Secondary | ICD-10-CM

## 2015-05-25 DIAGNOSIS — O358XX1 Maternal care for other (suspected) fetal abnormality and damage, fetus 1: Secondary | ICD-10-CM

## 2015-05-25 DIAGNOSIS — IMO0001 Reserved for inherently not codable concepts without codable children: Secondary | ICD-10-CM

## 2015-05-26 LAB — CMV IGM: CMV IgM: <30 AU/mL (ref 0.0–29.9)

## 2015-05-26 LAB — CMV ANTIBODY, IGG (EIA): CMV Ab - IgG: 4.2 U/mL — ABNORMAL HIGH (ref 0.00–0.59)

## 2015-05-26 LAB — TOXOPLASMA ANTIBODIES- IGG AND  IGM: Toxoplasma Antibody- IgM: <3 AU/mL (ref 0.0–7.9)

## 2015-05-29 LAB — PARVOVIRUS B19 ANTIBODY, IGG AND IGM
Parovirus B19 IgG Abs: 0.3 index (ref 0.0–0.8)
Parovirus B19 IgM Abs: 0.1 index (ref 0.0–0.8)

## 2015-05-31 ENCOUNTER — Telehealth (HOSPITAL_COMMUNITY): Payer: Self-pay | Admitting: *Deleted

## 2015-05-31 NOTE — Telephone Encounter (Signed)
Called Robin Valenzuela to inform her of recent lab results.  Her mother answered the phone.  She requested to know the labs results and I informed her to please have Robin Valenzuela call back for these results.

## 2015-06-21 ENCOUNTER — Ambulatory Visit (HOSPITAL_COMMUNITY): Payer: Medicaid Other

## 2015-06-22 ENCOUNTER — Ambulatory Visit (HOSPITAL_COMMUNITY)
Admission: RE | Admit: 2015-06-22 | Discharge: 2015-06-22 | Disposition: A | Discharge status: Home / Self Care | Payer: Medicaid Other | Source: Ambulatory Visit | Attending: Maternal and Fetal Medicine | Admitting: Maternal and Fetal Medicine

## 2015-06-22 ENCOUNTER — Other Ambulatory Visit (HOSPITAL_COMMUNITY): Payer: Self-pay | Admitting: Maternal and Fetal Medicine

## 2015-06-22 ENCOUNTER — Ambulatory Visit (HOSPITAL_COMMUNITY): Payer: Medicaid Other

## 2015-06-22 DIAGNOSIS — Z8279 Family history of other congenital malformations, deformations and chromosomal abnormalities: Secondary | ICD-10-CM | POA: Diagnosis not present

## 2015-06-22 DIAGNOSIS — O283 Abnormal ultrasonic finding on antenatal screening of mother: Secondary | ICD-10-CM

## 2015-06-22 DIAGNOSIS — O350XX1 Maternal care for (suspected) central nervous system malformation in fetus, fetus 1: Secondary | ICD-10-CM

## 2015-06-22 DIAGNOSIS — O3503X1 Maternal care for (suspected) central nervous system malformation or damage in fetus, choroid plexus cysts, fetus 1: Secondary | ICD-10-CM

## 2015-06-22 DIAGNOSIS — Z3A22 22 weeks gestation of pregnancy: Secondary | ICD-10-CM

## 2015-06-22 DIAGNOSIS — O30042 Twin pregnancy, dichorionic/diamniotic, second trimester: Secondary | ICD-10-CM | POA: Diagnosis present

## 2015-06-22 DIAGNOSIS — O358XX1 Maternal care for other (suspected) fetal abnormality and damage, fetus 1: Secondary | ICD-10-CM

## 2015-07-19 ENCOUNTER — Other Ambulatory Visit (HOSPITAL_COMMUNITY): Payer: Self-pay | Admitting: Maternal and Fetal Medicine

## 2015-07-19 ENCOUNTER — Encounter (HOSPITAL_COMMUNITY): Payer: Self-pay

## 2015-07-19 ENCOUNTER — Ambulatory Visit (HOSPITAL_COMMUNITY)
Admission: RE | Admit: 2015-07-19 | Discharge: 2015-07-19 | Disposition: A | Discharge status: Home / Self Care | Payer: Medicaid Other | Source: Ambulatory Visit | Attending: Obstetrics and Gynecology | Admitting: Obstetrics and Gynecology

## 2015-07-19 DIAGNOSIS — O350XX1 Maternal care for (suspected) central nervous system malformation in fetus, fetus 1: Secondary | ICD-10-CM

## 2015-07-19 DIAGNOSIS — O283 Abnormal ultrasonic finding on antenatal screening of mother: Secondary | ICD-10-CM | POA: Insufficient documentation

## 2015-07-19 DIAGNOSIS — Z8279 Family history of other congenital malformations, deformations and chromosomal abnormalities: Secondary | ICD-10-CM

## 2015-07-19 DIAGNOSIS — O30042 Twin pregnancy, dichorionic/diamniotic, second trimester: Secondary | ICD-10-CM | POA: Insufficient documentation

## 2015-07-19 DIAGNOSIS — IMO0001 Reserved for inherently not codable concepts without codable children: Secondary | ICD-10-CM

## 2015-07-19 DIAGNOSIS — O3503X1 Maternal care for (suspected) central nervous system malformation or damage in fetus, choroid plexus cysts, fetus 1: Secondary | ICD-10-CM

## 2015-07-19 DIAGNOSIS — O358XX1 Maternal care for other (suspected) fetal abnormality and damage, fetus 1: Secondary | ICD-10-CM

## 2015-07-19 DIAGNOSIS — Z3A25 25 weeks gestation of pregnancy: Secondary | ICD-10-CM

## 2015-07-19 DIAGNOSIS — O358XX9 Maternal care for other (suspected) fetal abnormality and damage, other fetus: Secondary | ICD-10-CM | POA: Diagnosis present

## 2015-07-19 DIAGNOSIS — O358XX0 Maternal care for other (suspected) fetal abnormality and damage, not applicable or unspecified: Secondary | ICD-10-CM | POA: Diagnosis not present

## 2015-07-19 MED ORDER — BETAMETHASONE SOD PHOS & ACET 6 (3-3) MG/ML IJ SUSP
12.0000 mg | Freq: Once | INTRAMUSCULAR | Status: AC
  Administered 2015-07-19: 12 mg via INTRAMUSCULAR
  Filled 2015-07-19: qty 2

## 2015-07-20 ENCOUNTER — Ambulatory Visit (HOSPITAL_COMMUNITY)
Admission: RE | Admit: 2015-07-20 | Discharge: 2015-07-20 | Disposition: A | Discharge status: Home / Self Care | Payer: Medicaid Other | Source: Ambulatory Visit | Attending: Obstetrics and Gynecology | Admitting: Obstetrics and Gynecology

## 2015-07-20 DIAGNOSIS — O358XX1 Maternal care for other (suspected) fetal abnormality and damage, fetus 1: Secondary | ICD-10-CM | POA: Insufficient documentation

## 2015-07-20 DIAGNOSIS — O283 Abnormal ultrasonic finding on antenatal screening of mother: Secondary | ICD-10-CM | POA: Insufficient documentation

## 2015-07-20 DIAGNOSIS — Z3A25 25 weeks gestation of pregnancy: Secondary | ICD-10-CM | POA: Insufficient documentation

## 2015-07-20 MED ORDER — BETAMETHASONE SOD PHOS & ACET 6 (3-3) MG/ML IJ SUSP
12.0000 mg | Freq: Once | INTRAMUSCULAR | Status: AC
  Administered 2015-07-20: 12 mg via INTRAMUSCULAR
  Filled 2015-07-20: qty 2

## 2015-07-22 ENCOUNTER — Inpatient Hospital Stay (HOSPITAL_COMMUNITY): Payer: Medicaid Other

## 2015-07-22 ENCOUNTER — Inpatient Hospital Stay (HOSPITAL_COMMUNITY)
Admission: AD | Admit: 2015-07-22 | Discharge: 2015-07-23 | DRG: 781 | Disposition: A | Discharge status: Home / Self Care | Payer: Medicaid Other | Source: Ambulatory Visit | Attending: Obstetrics and Gynecology | Admitting: Obstetrics and Gynecology

## 2015-07-22 ENCOUNTER — Encounter (HOSPITAL_COMMUNITY): Payer: Self-pay | Admitting: *Deleted

## 2015-07-22 DIAGNOSIS — A5901 Trichomonal vulvovaginitis: Secondary | ICD-10-CM | POA: Diagnosis present

## 2015-07-22 DIAGNOSIS — O30042 Twin pregnancy, dichorionic/diamniotic, second trimester: Secondary | ICD-10-CM | POA: Diagnosis present

## 2015-07-22 DIAGNOSIS — A599 Trichomoniasis, unspecified: Secondary | ICD-10-CM | POA: Diagnosis present

## 2015-07-22 DIAGNOSIS — O99332 Smoking (tobacco) complicating pregnancy, second trimester: Secondary | ICD-10-CM | POA: Diagnosis present

## 2015-07-22 DIAGNOSIS — R109 Unspecified abdominal pain: Secondary | ICD-10-CM

## 2015-07-22 DIAGNOSIS — O26892 Other specified pregnancy related conditions, second trimester: Secondary | ICD-10-CM | POA: Diagnosis present

## 2015-07-22 DIAGNOSIS — R030 Elevated blood-pressure reading, without diagnosis of hypertension: Secondary | ICD-10-CM | POA: Diagnosis present

## 2015-07-22 DIAGNOSIS — Z3A26 26 weeks gestation of pregnancy: Secondary | ICD-10-CM

## 2015-07-22 DIAGNOSIS — N10 Acute tubulo-interstitial nephritis: Secondary | ICD-10-CM

## 2015-07-22 DIAGNOSIS — IMO0001 Reserved for inherently not codable concepts without codable children: Secondary | ICD-10-CM

## 2015-07-22 DIAGNOSIS — O283 Abnormal ultrasonic finding on antenatal screening of mother: Secondary | ICD-10-CM

## 2015-07-22 DIAGNOSIS — O98312 Other infections with a predominantly sexual mode of transmission complicating pregnancy, second trimester: Secondary | ICD-10-CM | POA: Diagnosis present

## 2015-07-22 DIAGNOSIS — O2302 Infections of kidney in pregnancy, second trimester: Secondary | ICD-10-CM | POA: Diagnosis not present

## 2015-07-22 DIAGNOSIS — F17210 Nicotine dependence, cigarettes, uncomplicated: Secondary | ICD-10-CM | POA: Diagnosis present

## 2015-07-22 DIAGNOSIS — O2300 Infections of kidney in pregnancy, unspecified trimester: Secondary | ICD-10-CM | POA: Diagnosis present

## 2015-07-22 LAB — ABO/RH: ABO/RH(D): A POS

## 2015-07-22 LAB — CBC WITH DIFFERENTIAL/PLATELET
Basophils Absolute: 0 10*3/uL (ref 0.0–0.1)
Basophils Relative: 0 %
EOS PCT: 1 %
Eosinophils Absolute: 0.1 10*3/uL (ref 0.0–0.7)
HEMATOCRIT: 26.3 % — AB (ref 36.0–46.0)
HEMOGLOBIN: 9 g/dL — AB (ref 12.0–15.0)
LYMPHS ABS: 2.6 10*3/uL (ref 0.7–4.0)
LYMPHS PCT: 18 %
MCH: 31.4 pg (ref 26.0–34.0)
MCHC: 34.2 g/dL (ref 30.0–36.0)
MCV: 91.6 fL (ref 78.0–100.0)
Monocytes Absolute: 1.1 10*3/uL — ABNORMAL HIGH (ref 0.1–1.0)
Monocytes Relative: 8 %
Neutro Abs: 10.8 10*3/uL — ABNORMAL HIGH (ref 1.7–7.7)
Neutrophils Relative %: 73 %
Platelets: 253 10*3/uL (ref 150–400)
RBC: 2.87 MIL/uL — AB (ref 3.87–5.11)
RDW: 13.2 % (ref 11.5–15.5)
WBC: 14.5 10*3/uL — AB (ref 4.0–10.5)

## 2015-07-22 LAB — URINE MICROSCOPIC-ADD ON

## 2015-07-22 LAB — OB RESULTS CONSOLE GC/CHLAMYDIA: GC PROBE AMP, GENITAL: NEGATIVE

## 2015-07-22 LAB — COMPREHENSIVE METABOLIC PANEL
ALK PHOS: 87 U/L (ref 38–126)
ALT: 11 U/L — AB (ref 14–54)
AST: 16 U/L (ref 15–41)
Albumin: 2.9 g/dL — ABNORMAL LOW (ref 3.5–5.0)
Anion gap: 7 (ref 5–15)
BILIRUBIN TOTAL: 0.1 mg/dL — AB (ref 0.3–1.2)
BUN: 6 mg/dL (ref 6–20)
CALCIUM: 8.8 mg/dL — AB (ref 8.9–10.3)
CO2: 22 mmol/L (ref 22–32)
CREATININE: 0.45 mg/dL (ref 0.44–1.00)
Chloride: 106 mmol/L (ref 101–111)
Glucose, Bld: 92 mg/dL (ref 65–99)
Potassium: 3.1 mmol/L — ABNORMAL LOW (ref 3.5–5.1)
Sodium: 135 mmol/L (ref 135–145)
Total Protein: 6.6 g/dL (ref 6.5–8.1)

## 2015-07-22 LAB — URINALYSIS, ROUTINE W REFLEX MICROSCOPIC
Bilirubin Urine: NEGATIVE
Glucose, UA: NEGATIVE mg/dL
Ketones, ur: NEGATIVE mg/dL
Nitrite: NEGATIVE
Protein, ur: NEGATIVE mg/dL
SPECIFIC GRAVITY, URINE: 1.02 (ref 1.005–1.030)
pH: 6.5 (ref 5.0–8.0)

## 2015-07-22 LAB — LIPASE, BLOOD: LIPASE: 26 U/L (ref 11–51)

## 2015-07-22 LAB — PROTEIN / CREATININE RATIO, URINE
Creatinine, Urine: 218 mg/dL
Protein Creatinine Ratio: 0.22 mg/mg{Cre} — ABNORMAL HIGH (ref 0.00–0.15)
Total Protein, Urine: 49 mg/dL

## 2015-07-22 LAB — TYPE AND SCREEN
ABO/RH(D): A POS
ANTIBODY SCREEN: NEGATIVE

## 2015-07-22 MED ORDER — PRENATAL MULTIVITAMIN CH
1.0000 | ORAL_TABLET | Freq: Every day | ORAL | Status: DC
  Administered 2015-07-23: 1 via ORAL
  Filled 2015-07-22 (×2): qty 1

## 2015-07-22 MED ORDER — POTASSIUM CITRATE ER 10 MEQ (1080 MG) PO TBCR: 40.0000 meq | EXTENDED_RELEASE_TABLET | Freq: Once | ORAL | Status: DC

## 2015-07-22 MED ORDER — ONDANSETRON 4 MG PO TBDP: 4.0000 mg | ORAL_TABLET | Freq: Three times a day (TID) | ORAL | Status: DC | PRN

## 2015-07-22 MED ORDER — DEXTROSE 5 % IV SOLN
1.0000 g | Freq: Two times a day (BID) | INTRAVENOUS | Status: DC
  Administered 2015-07-22 – 2015-07-23 (×3): 1 g via INTRAVENOUS
  Filled 2015-07-22 (×4): qty 10

## 2015-07-22 MED ORDER — SERTRALINE HCL 50 MG PO TABS
50.0000 mg | ORAL_TABLET | Freq: Every day | ORAL | Status: DC
  Administered 2015-07-22: 50 mg via ORAL
  Filled 2015-07-22 (×2): qty 1

## 2015-07-22 MED ORDER — CALCIUM CARBONATE ANTACID 500 MG PO CHEW
2.0000 | CHEWABLE_TABLET | ORAL | Status: DC | PRN
  Filled 2015-07-22: qty 2

## 2015-07-22 MED ORDER — SODIUM CHLORIDE 0.9 % IV SOLN
INTRAVENOUS | Status: DC
  Administered 2015-07-22: 16:00:00 via INTRAVENOUS

## 2015-07-22 MED ORDER — ZOLPIDEM TARTRATE 5 MG PO TABS: 5.0000 mg | ORAL_TABLET | Freq: Every evening | ORAL | Status: DC | PRN

## 2015-07-22 MED ORDER — METRONIDAZOLE 500 MG PO TABS
2000.0000 mg | ORAL_TABLET | Freq: Once | ORAL | Status: AC
  Administered 2015-07-22: 2000 mg via ORAL
  Filled 2015-07-22: qty 4

## 2015-07-22 MED ORDER — POTASSIUM CHLORIDE CRYS ER 20 MEQ PO TBCR
40.0000 meq | EXTENDED_RELEASE_TABLET | Freq: Once | ORAL | Status: AC
  Administered 2015-07-22: 40 meq via ORAL
  Filled 2015-07-22: qty 2

## 2015-07-22 MED ORDER — DOCUSATE SODIUM 100 MG PO CAPS
100.0000 mg | ORAL_CAPSULE | Freq: Every day | ORAL | Status: DC
  Administered 2015-07-22 – 2015-07-23 (×2): 100 mg via ORAL
  Filled 2015-07-22 (×4): qty 1

## 2015-07-22 MED ORDER — ACETAMINOPHEN 325 MG PO TABS: 650.0000 mg | ORAL_TABLET | ORAL | Status: DC | PRN

## 2015-07-22 NOTE — Progress Notes (Signed)
Pt denies feeling ctxs 

## 2015-07-22 NOTE — MAU Note (Signed)
R side pain that started yesterday @ 1900, pain starts @ R rib area, radiates down into RLQ.  Denies bleeding or LOF.  Pt states cervical length last Thursday in MFM was 1.5 cm's.  Has received BMZ.

## 2015-07-22 NOTE — H&P (Signed)
Antenatal HISTORY AND PHYSICAL  Chief Complaint:  Right sided pain  Robin Valenzuela is a 34 y.o.  G2P1001 with IUP at 68w2dpresenting for above complaint.  Symptoms began yesterday evening. Felt a "pop" after picking up daughter. Right side at low rib cage. Constant pain, worse with movement and deep inspiration. No fever or chills. Has had a cough for a while, productive. No dysuria or hematuria or frequency or urgency. No nausea or vomiting. No ha or vision change. This has never happened before. Endorses positive fetal movement, no ctxns, no bleeding.     Past Medical History  Diagnosis Date  . Abnormal Pap smear   . Anxiety   . Dichorionic diamniotic twin gestation 03/2015    Past Surgical History  Procedure Laterality Date  . Cholecystectomy    . Rhinoplasty for cleft lip / palate    . Cryotherapy    . Colposcopy    . Wisdom tooth extraction    . Cleft lip repair Left     History reviewed. No pertinent family history.  Social History  Substance Use Topics  . Smoking status: Current Every Day Smoker -- 0.50 packs/day for 15 years    Types: Cigarettes  . Smokeless tobacco: Never Used  . Alcohol Use: No    No Known Allergies  Prescriptions prior to admission  Medication Sig Dispense Refill Last Dose  . Doxylamine-Pyridoxine (DICLEGIS PO) Take by mouth.   Taking  . Prenatal Vit-Fe Fumarate-FA (PRENATAL MULTIVITAMIN) TABS tablet Take 1 tablet by mouth daily at 12 noon.   Taking  . Sertraline HCl (ZOLOFT PO) Take by mouth.   Taking    Review of Systems - Negative except for what is mentioned in HPI.  Physical Exam  Blood pressure 156/79, pulse 104, temperature 98 F (36.7 C), temperature source Oral, resp. rate 20, last menstrual period 01/25/2015, unknown if currently breastfeeding. GENERAL: Well-developed, well-nourished female in no acute distress.  LUNGS: Clear to auscultation bilaterally.  HEART: Regular rate and rhythm. Back: Tenderness right flank ABDOMEN:  Soft, nontender, nondistended, gravid.  EXTREMITIES: Nontender, no edema, 2+ distal pulses. FHT:  140/mod/+a/-3, 145/mod/+a/-d Contractions: Every 5-10 Cervix: closed/thick/high   Labs: Recent Results (from the past 2160 hour(s))  Cmv antibody, IgG (EIA)     Status: Abnormal   Collection Time: 05/25/15 10:15 AM  Result Value Ref Range   CMV Ab - IgG 4.20 (H) 0.00 - 0.59 U/mL    Comment: (NOTE)                               Negative          <0.60                               Equivocal   0.60 - 0.69                               Positive          >0.69 Performed At: [facility] [id number] RompMD PSA:[id number]  CMV IgM     Status: None   Collection Time: 05/25/15 10:15 AM  Result Value Ref Range   CMV IgM <30.0 0.0 - 29.9 AU/mL    Comment: (NOTE)  Negative         <30.0                                Equivocal  30.0 - 34.9                                Positive         >34.9 A positive result is generally indicative of acute infection, reactivation or persistent IgM production. Performed At: [facility] [place], [place] [id number] Lindon Romp MD KW:[id number]   Toxoplasma antibodies- IgG and  IgM     Status: None   Collection Time: 05/25/15 10:15 AM  Result Value Ref Range   Toxoplasma IgG Ratio <3.0 0.0 - 7.1 IU/mL    Comment: (NOTE)                                 Negative        <7.2                                 Equivocal  7.2 - 8.7                                 Positive        >8.7    Toxoplasma Antibody- IgM <3.0 0.0 - 7.9 AU/mL    Comment: (NOTE)                             Negative            <8.0                             Equivocal      8.0 - 9.9                             Positive            >9.9    Comment Comment     Comment: (NOTE) No serological evidence of infection with Toxoplasma. If symptoms persist, submit a new specimen after  three weeks. Performed At: [facility] [place], [place] [id number] Lindon Romp MD QQ:[id number]   Parvovirus B19 antibody, IgG and IgM     Status: None   Collection Time: 05/25/15 10:15 AM  Result Value Ref Range   Parovirus B19 IgG Abs 0.3 0.0 - 0.8 index    Comment: (NOTE)                                 Negative        <0.9                                 Equivocal  0.9 - 1.1  Positive        >1.1    Parovirus B19 IgM Abs 0.1 0.0 - 0.8 index    Comment: (NOTE)                                 Negative        <0.9                                 Equivocal  0.9 - 1.1                                 Positive        >1.1 Performed At: [facility] [place], [place] [id number] Lindon Romp MD NL:[id number]   Urinalysis, Routine w reflex microscopic (not at [facility])     Status: Abnormal   Collection Time: 07/22/15 12:17 PM  Result Value Ref Range   Color, Urine YELLOW YELLOW   APPearance CLEAR CLEAR   Specific Gravity, Urine 1.020 1.005 - 1.030   pH 6.5 5.0 - 8.0   Glucose, UA NEGATIVE NEGATIVE mg/dL   Hgb urine dipstick TRACE (A) NEGATIVE   Bilirubin Urine NEGATIVE NEGATIVE   Ketones, ur NEGATIVE NEGATIVE mg/dL   Protein, ur NEGATIVE NEGATIVE mg/dL   Nitrite NEGATIVE NEGATIVE   Leukocytes, UA SMALL (A) NEGATIVE  Urine microscopic-add on     Status: Abnormal   Collection Time: 07/22/15 12:17 PM  Result Value Ref Range   Squamous Epithelial / LPF 0-5 (A) NONE SEEN   WBC, UA TOO NUMEROUS TO COUNT 0 - 5 WBC/hpf   RBC / HPF 6-30 0 - 5 RBC/hpf   Bacteria, UA RARE (A) NONE SEEN   Trichomonas, UA PRESENT    Urine-Other MUCOUS PRESENT   Protein / creatinine ratio, urine     Status: Abnormal   Collection Time: 07/22/15 12:17 PM  Result Value Ref Range   Creatinine, Urine 218.00 mg/dL   Total Protein, Urine 49 mg/dL    Comment: NO NORMAL RANGE ESTABLISHED FOR THIS TEST   Protein Creatinine Ratio  0.22 (H) 0.00 - 0.15 mg/mg[Cre]  CBC with Differential/Platelet     Status: Abnormal   Collection Time: 07/22/15  1:30 PM  Result Value Ref Range   WBC 14.5 (H) 4.0 - 10.5 K/uL   RBC 2.87 (L) 3.87 - 5.11 MIL/uL   Hemoglobin 9.0 (L) 12.0 - 15.0 g/dL   HCT 26.3 (L) 36.0 - 46.0 %   MCV 91.6 78.0 - 100.0 fL   MCH 31.4 26.0 - 34.0 pg   MCHC 34.2 30.0 - 36.0 g/dL   RDW 13.2 11.5 - 15.5 %   Platelets 253 150 - 400 K/uL   Neutrophils Relative % 73 %   Neutro Abs 10.8 (H) 1.7 - 7.7 K/uL   Lymphocytes Relative 18 %   Lymphs Abs 2.6 0.7 - 4.0 K/uL   Monocytes Relative 8 %   Monocytes Absolute 1.1 (H) 0.1 - 1.0 K/uL   Eosinophils Relative 1 %   Eosinophils Absolute 0.1 0.0 - 0.7 K/uL   Basophils Relative 0 %   Basophils Absolute 0.0 0.0 - 0.1 K/uL  Comprehensive metabolic panel     Status: Abnormal   Collection Time: 07/22/15  1:30 PM  Result Value Ref Range   Sodium  135 135 - 145 mmol/L   Potassium 3.1 (L) 3.5 - 5.1 mmol/L   Chloride 106 101 - 111 mmol/L   CO2 22 22 - 32 mmol/L   Glucose, Bld 92 65 - 99 mg/dL   BUN 6 6 - 20 mg/dL   Creatinine, Ser 0.45 0.44 - 1.00 mg/dL   Calcium 8.8 (L) 8.9 - 10.3 mg/dL   Total Protein 6.6 6.5 - 8.1 g/dL   Albumin 2.9 (L) 3.5 - 5.0 g/dL   AST 16 15 - 41 U/L   ALT 11 (L) 14 - 54 U/L   Alkaline Phosphatase 87 38 - 126 U/L   Total Bilirubin 0.1 (L) 0.3 - 1.2 mg/dL   GFR calc non Af Amer >60 >60 mL/min   GFR calc Af Amer >60 >60 mL/min    Comment: (NOTE) The eGFR has been calculated using the CKD EPI equation. This calculation has not been validated in all clinical situations. eGFR's persistently <60 mL/min signify possible Chronic Kidney Disease.    Anion gap 7 5 - 15  Lipase, blood     Status: None   Collection Time: 07/22/15  1:30 PM  Result Value Ref Range   Lipase 26 11 - 51 U/L      Imaging Studies:  [place] Mfm Ob Transvaginal  07/19/2015  OBSTETRICAL ULTRASOUND: This exam was performed within a [place] [facility] Department. The OB  US report was generated in the AS system, and faxed to the ordering physician.  This report is available in the [facility]. See the AS Obstetric US report via the Image Link.  [place] Mfm Ob Follow Up  07/19/2015  OBSTETRICAL ULTRASOUND: This exam was performed within a [place] Ultrasound Department. The OB US report was generated in the AS system, and faxed to the ordering physician.  This report is available in the [facility]. See the AS Obstetric US report via the Image Link.  [place] Mfm Ob Follow Up Addl Gest  07/19/2015  OBSTETRICAL ULTRASOUND: This exam was performed within a [place] Ultrasound Department. The OB US report was generated in the AS system, and faxed to the ordering physician.  This report is available in the [facility]. See the AS Obstetric US report via the Image Link.   Assessment: Robin Valenzuela is  34 y.o. G2P1001 at 66w2dpresents with right flank pain. May represent musculoskeletal cause but given flank pain/tenderness and urinalysis suggestive of possible UTI, will admit and treat for pyelonephritis. Also trichomonas positive and with elevated blood pressure. .  Plan:  # Pyelonephritis: no fever or nausa or vomiting or symptoms of UTI, but does have flank pain and CBC with neutrophilic predominance and urinalysis with WBCs and hgb. Reactive NST. Contracting every 5-10 minutes but mild and cervix closed, do not think is in labor. - f/u urine culture - ceftriaxone 1 g bid - admit - recently received course of BMZ so not repeating  # Trichomonas - patient informed - metronidazole 2 g po once - f/u gonorrhea, chlamydia, hiv, and rpr  # Elevated blood pressure - sustained on multiple repeats - preE labs unremarakable and w/o preeclampsia symptoms; upc is 0.22 so 24-hour collection may be warranted - monitor   NOlen CordialB Eliseo Withers 12/11/20161:09 PM

## 2015-07-23 DIAGNOSIS — A599 Trichomoniasis, unspecified: Secondary | ICD-10-CM | POA: Diagnosis present

## 2015-07-23 LAB — CULTURE, OB URINE

## 2015-07-23 LAB — HIV ANTIBODY (ROUTINE TESTING W REFLEX): HIV SCREEN 4TH GENERATION: NONREACTIVE

## 2015-07-23 LAB — GC/CHLAMYDIA PROBE AMP (~~LOC~~) NOT AT ARMC
Chlamydia: NEGATIVE
NEISSERIA GONORRHEA: NEGATIVE

## 2015-07-23 LAB — RPR: RPR: NONREACTIVE

## 2015-07-23 MED ORDER — SODIUM CHLORIDE 0.9 % IJ SOLN: 3.0000 mL | INTRAMUSCULAR | Status: DC | PRN

## 2015-07-23 MED ORDER — DEXTROSE 5 % IV SOLN: 2.0000 g | INTRAVENOUS | Status: DC

## 2015-07-23 MED ORDER — CYCLOBENZAPRINE HCL 5 MG PO TABS: 5.0000 mg | ORAL_TABLET | Freq: Three times a day (TID) | ORAL | Status: DC | PRN

## 2015-07-23 MED ORDER — CYCLOBENZAPRINE HCL 10 MG PO TABS: 5.0000 mg | ORAL_TABLET | Freq: Three times a day (TID) | ORAL | Status: DC | PRN

## 2015-07-23 NOTE — Discharge Instructions (Signed)
Nothing in vagina till appointment in office If persistent headaches or blurry vision ensue call OBGYN or return to hospital for evaluation If pain persists or worsens notify OBGYN or return to hospital If contractions noted 5-2910mins apart please notify OBGYN or return to hospital Continue use of vaginal progesterone

## 2015-07-23 NOTE — Progress Notes (Addendum)
HD #1, right flank pain, 26 week twins Feels a little better, still sore right flank Afeb, VSS, BP is normal FHT- reactive NST on both twins +/- CVAT on right, more point tenderness I am not sure if she has pyelo or just musculoskeletal strain as source for her pain.  She has remained afebrile.  Will continue Rocephin today, assess this pm to see if could go home on PO antibiotics.

## 2015-07-23 NOTE — Progress Notes (Signed)
Patient ID: Robin EvertKrystal L Valenzuela, female   DOB: [birth year], 34 y.o.   MRN: [id number] Pt admitted last night for management of pylenonephritis. She received three doses of rocephin and has remained afebrile. Pain has significantly improved as well though occasionally gets similar pain to before when coughs - likely musculoskeletal. Will give flexeril to help. She is requesting discharge home.  BP noted to intermittently be elevated however LFTs and urine protein ratio were wnl. She denies any HAs or blurry vision. Counselled pt on preE precautions. Will plan to have her f/u in office in 2-3days for follow up and recheck of BP. Muscle relaxant could also help with pain and thus BP if responsible.   Pt has had some intermittent contractions but not appreciating any of them. Cervix 1cm dil. She had previously been noted to have a shortened cervix and is on vaginal progesterone  Was noted to have trichomonas in her urine on admission - treated with flagyl. Will need a TOC in a month as well as partner treated before resuming intercourse  Will plan to obtain FFN when seen in office so pt counselled to avoid anything in vaginal till appt in office this week

## 2015-07-23 NOTE — Progress Notes (Signed)
Dr. Mindi SlickerBanga @ bedside discussing POC.  Pt instructed to call for f/u appt on either Wednesday or Thursday for BP eval & cervical check.  Pt instructed nothing in vagina in 24 hours prior to appt.Pt verbalized understanding & agreeable.

## 2015-07-23 NOTE — Discharge Summary (Signed)
OB Discharge Summary     Patient Name: Robin EvertKrystal L Leeb DOB: [birth year] MRN: [id number]  Date of admission: 07/22/2015 Delivering MD: This patient has no babies on file.  Date of discharge: 07/23/2015  Admitting diagnosis: 26 WKS, PAIN LEFT SIDE Intrauterine pregnancy: 2935w3d     Secondary diagnosis:  Active Problems:   Pyelonephritis affecting pregnancy   Trichomonas infection  Additional problems: labile BP     Discharge diagnosis: Di-Di twin gestation with likely musculoskeletal pain, labile BP and trichomonas infection                                                                                                Post partum procedures:n/a  Augmentation: n/a  Complications: None  Hospital course: [facility]flank pain. Treated for pyelonephritis with rocephin. Also noted to have trichomonas. Was treated overnight with flagylPt remained afebrile and pain resolved except with coughing.  D/C home with flexeril. BP was intermittently elevated in hospital but lfts and urine protein ratio wnl. Discharged home with preE precautions as well as labor precautions due to cervix 1cm dil and intermittent contractions. Continue vaginal progesterone as before. Pt to have FFN done when seen in office this week. S/P betamethasone a week ago. Counselled to have pelvic rest for 1-2days prior to appt. Will need TOC for trich in a month  Physical exam  Filed Vitals:   07/23/15 0053 07/23/15 0758 07/23/15 1212 07/23/15 1602  BP: 126/68 130/82 149/76 153/92  Pulse: 75 84 95 100  Temp: 97.6 F (36.4 C) 97.8 F (36.6 C) 97.9 F (36.6 C) 98.6 F (37 C)  TempSrc: Oral Oral Oral Oral  Resp: 18 20 20 18   Height:      Weight:      SpO2:       General: alert, cooperative and no distress Lochia: appropriate Uterine Fundus: c/w twin gestation at 26weeks DVT Evaluation: Negative Homan's sign. Labs: Lab Results  Component Value Date   WBC 14.5* 07/22/2015   HGB 9.0*  07/22/2015   HCT 26.3* 07/22/2015   MCV 91.6 07/22/2015   PLT 253 07/22/2015   CMP Latest Ref Rng 07/22/2015  Glucose 65 - 99 mg/dL 92  BUN 6 - 20 mg/dL 6  Creatinine 4.090.44 - 8.111.00 mg/dL 9.140.45  Sodium 782135 - 956145 mmol/L 135  Potassium 3.5 - 5.1 mmol/L 3.1(L)  Chloride 101 - 111 mmol/L 106  CO2 22 - 32 mmol/L 22  Calcium 8.9 - 10.3 mg/dL 2.1(H8.8(L)  Total Protein 6.5 - 8.1 g/dL 6.6  Total Bilirubin 0.3 - 1.2 mg/dL 0.8(M0.1(L)  Alkaline Phos 38 - 126 U/L 87  AST 15 - 41 U/L 16  ALT 14 - 54 U/L 11(L)    Discharge instruction: per After Visit Summary and "Baby and Me Booklet".  After visit meds:    Medication List    TAKE these medications        [employer] PRENATAL GUMMY PO  Take 2 capsules by mouth daily.     cyclobenzaprine 5 MG tablet  Commonly known as:  FLEXERIL  Take 1 tablet (5  mg total) by mouth 3 (three) times daily as needed for muscle spasms.     ondansetron 4 MG disintegrating tablet  Commonly known as:  ZOFRAN-ODT  Take 4 mg by mouth every 8 (eight) hours as needed for nausea or vomiting.     sertraline 50 MG tablet  Commonly known as:  ZOLOFT  Take 50 mg by mouth at bedtime.        Diet: routine diet  Activity: Advance as tolerated. Pelvic rest for 6 weeks.   Outpatient follow up:2-3days Follow up Appt:Future Appointments Date Time Provider Department Center  08/16/2015 9:00 AM [facility] [place] 1 [facility] 203   Follow up Visit:No Follow-up on file.  Postpartum contraception: Not Discussed  Newborn Data: This patient has no babies on file. Baby Feeding: n/a Disposition:undelivered   07/23/2015 Shalona Harbour Worema Betzabeth Derringer, DO

## 2015-08-16 ENCOUNTER — Ambulatory Visit (HOSPITAL_COMMUNITY)
Admission: RE | Admit: 2015-08-16 | Discharge: 2015-08-16 | Disposition: A | Discharge status: Home / Self Care | Payer: Medicaid Other | Source: Ambulatory Visit | Attending: Obstetrics and Gynecology | Admitting: Obstetrics and Gynecology

## 2015-08-16 ENCOUNTER — Inpatient Hospital Stay (HOSPITAL_COMMUNITY)
Admission: AD | Admit: 2015-08-16 | Discharge: 2015-08-16 | Disposition: A | Discharge status: Home / Self Care | Payer: Medicaid Other | Source: Ambulatory Visit | Attending: Obstetrics and Gynecology | Admitting: Obstetrics and Gynecology

## 2015-08-16 ENCOUNTER — Encounter (HOSPITAL_COMMUNITY): Payer: Self-pay

## 2015-08-16 VITALS — BP 156/94 | HR 98 | Wt 166.4 lb

## 2015-08-16 DIAGNOSIS — O350XX1 Maternal care for (suspected) central nervous system malformation in fetus, fetus 1: Secondary | ICD-10-CM | POA: Diagnosis not present

## 2015-08-16 DIAGNOSIS — O350XX0 Maternal care for (suspected) central nervous system malformation in fetus, not applicable or unspecified: Secondary | ICD-10-CM | POA: Diagnosis not present

## 2015-08-16 DIAGNOSIS — O30043 Twin pregnancy, dichorionic/diamniotic, third trimester: Secondary | ICD-10-CM | POA: Insufficient documentation

## 2015-08-16 DIAGNOSIS — O358XX0 Maternal care for other (suspected) fetal abnormality and damage, not applicable or unspecified: Secondary | ICD-10-CM | POA: Insufficient documentation

## 2015-08-16 DIAGNOSIS — O30042 Twin pregnancy, dichorionic/diamniotic, second trimester: Secondary | ICD-10-CM

## 2015-08-16 DIAGNOSIS — O283 Abnormal ultrasonic finding on antenatal screening of mother: Secondary | ICD-10-CM | POA: Insufficient documentation

## 2015-08-16 DIAGNOSIS — O358XX1 Maternal care for other (suspected) fetal abnormality and damage, fetus 1: Secondary | ICD-10-CM | POA: Diagnosis not present

## 2015-08-16 DIAGNOSIS — O350XX2 Maternal care for (suspected) central nervous system malformation in fetus, fetus 2: Secondary | ICD-10-CM | POA: Insufficient documentation

## 2015-08-16 DIAGNOSIS — Z3A28 28 weeks gestation of pregnancy: Secondary | ICD-10-CM | POA: Insufficient documentation

## 2015-08-16 LAB — CBC
HCT: 30.9 % — ABNORMAL LOW (ref 36.0–46.0)
Hemoglobin: 10.5 g/dL — ABNORMAL LOW (ref 12.0–15.0)
MCH: 30.6 pg (ref 26.0–34.0)
MCHC: 34 g/dL (ref 30.0–36.0)
MCV: 90.1 fL (ref 78.0–100.0)
Platelets: 307 10*3/uL (ref 150–400)
RBC: 3.43 MIL/uL — ABNORMAL LOW (ref 3.87–5.11)
RDW: 12.8 % (ref 11.5–15.5)
WBC: 14.4 10*3/uL — AB (ref 4.0–10.5)

## 2015-08-16 NOTE — MAU Note (Signed)
Urine in lab 

## 2015-08-16 NOTE — MAU Note (Signed)
Pt sent from office . Had 3+ protien . Told at first she was gong to be direct admited. But 24 hr protein and labs came back in office and they where OK.  told to come for  An NST and then if it was ok she could go home

## 2015-08-16 NOTE — MAU Note (Signed)
Pt sent from office for NST and blood work

## 2015-08-16 NOTE — ED Notes (Signed)
Called patients OB office and left message for Dr. Jackelyn KnifeMeisinger or Dr. Milana KidneyMeisingers nurse that patient is on her way with 24hr urine.  Notified of BP's taken here today.  Gave message to receptionist who will show to MD.

## 2015-09-03 ENCOUNTER — Inpatient Hospital Stay (HOSPITAL_COMMUNITY)
Admission: AD | Admit: 2015-09-03 | Discharge: 2015-09-08 | DRG: 765 | Disposition: A | Discharge status: Home / Self Care | Payer: Medicaid Other | Source: Ambulatory Visit | Attending: Obstetrics and Gynecology | Admitting: Obstetrics and Gynecology

## 2015-09-03 ENCOUNTER — Encounter (HOSPITAL_COMMUNITY): Payer: Self-pay | Admitting: *Deleted

## 2015-09-03 ENCOUNTER — Inpatient Hospital Stay (HOSPITAL_COMMUNITY): Payer: Medicaid Other

## 2015-09-03 DIAGNOSIS — O3503X2 Maternal care for (suspected) central nervous system malformation or damage in fetus, choroid plexus cysts, fetus 2: Secondary | ICD-10-CM

## 2015-09-03 DIAGNOSIS — O30043 Twin pregnancy, dichorionic/diamniotic, third trimester: Secondary | ICD-10-CM | POA: Diagnosis present

## 2015-09-03 DIAGNOSIS — O283 Abnormal ultrasonic finding on antenatal screening of mother: Secondary | ICD-10-CM

## 2015-09-03 DIAGNOSIS — O321XX1 Maternal care for breech presentation, fetus 1: Secondary | ICD-10-CM | POA: Diagnosis present

## 2015-09-03 DIAGNOSIS — O30049 Twin pregnancy, dichorionic/diamniotic, unspecified trimester: Secondary | ICD-10-CM

## 2015-09-03 DIAGNOSIS — O99334 Smoking (tobacco) complicating childbirth: Secondary | ICD-10-CM | POA: Diagnosis present

## 2015-09-03 DIAGNOSIS — O1404 Mild to moderate pre-eclampsia, complicating childbirth: Secondary | ICD-10-CM | POA: Diagnosis present

## 2015-09-03 DIAGNOSIS — Z302 Encounter for sterilization: Secondary | ICD-10-CM | POA: Diagnosis not present

## 2015-09-03 DIAGNOSIS — O134 Gestational [pregnancy-induced] hypertension without significant proteinuria, complicating childbirth: Secondary | ICD-10-CM | POA: Diagnosis present

## 2015-09-03 DIAGNOSIS — O358XX1 Maternal care for other (suspected) fetal abnormality and damage, fetus 1: Secondary | ICD-10-CM

## 2015-09-03 DIAGNOSIS — O42913 Preterm premature rupture of membranes, unspecified as to length of time between rupture and onset of labor, third trimester: Secondary | ICD-10-CM | POA: Diagnosis present

## 2015-09-03 DIAGNOSIS — O4290 Premature rupture of membranes, unspecified as to length of time between rupture and onset of labor, unspecified weeks of gestation: Secondary | ICD-10-CM

## 2015-09-03 DIAGNOSIS — O99333 Smoking (tobacco) complicating pregnancy, third trimester: Secondary | ICD-10-CM

## 2015-09-03 DIAGNOSIS — Z3A32 32 weeks gestation of pregnancy: Secondary | ICD-10-CM

## 2015-09-03 DIAGNOSIS — F17210 Nicotine dependence, cigarettes, uncomplicated: Secondary | ICD-10-CM | POA: Diagnosis present

## 2015-09-03 DIAGNOSIS — Z8279 Family history of other congenital malformations, deformations and chromosomal abnormalities: Secondary | ICD-10-CM

## 2015-09-03 DIAGNOSIS — O350XX2 Maternal care for (suspected) central nervous system malformation in fetus, fetus 2: Secondary | ICD-10-CM

## 2015-09-03 LAB — CBC
HCT: 28.6 % — ABNORMAL LOW (ref 36.0–46.0)
HEMATOCRIT: 28.7 % — AB (ref 36.0–46.0)
Hemoglobin: 9.7 g/dL — ABNORMAL LOW (ref 12.0–15.0)
Hemoglobin: 9.6 g/dL — ABNORMAL LOW (ref 12.0–15.0)
MCH: 29.8 pg (ref 26.0–34.0)
MCH: 29.5 pg (ref 26.0–34.0)
MCHC: 33.8 g/dL (ref 30.0–36.0)
MCHC: 33.6 g/dL (ref 30.0–36.0)
MCV: 88 fL (ref 78.0–100.0)
MCV: 88 fL (ref 78.0–100.0)
PLATELETS: 354 10*3/uL (ref 150–400)
Platelets: 345 10*3/uL (ref 150–400)
RBC: 3.26 MIL/uL — ABNORMAL LOW (ref 3.87–5.11)
RBC: 3.25 MIL/uL — AB (ref 3.87–5.11)
RDW: 12.8 % (ref 11.5–15.5)
RDW: 12.8 % (ref 11.5–15.5)
WBC: 17.1 10*3/uL — ABNORMAL HIGH (ref 4.0–10.5)
WBC: 15.9 10*3/uL — AB (ref 4.0–10.5)

## 2015-09-03 LAB — COMPREHENSIVE METABOLIC PANEL
ALBUMIN: 2.6 g/dL — AB (ref 3.5–5.0)
ALT: 8 U/L — ABNORMAL LOW (ref 14–54)
AST: 17 U/L (ref 15–41)
Alkaline Phosphatase: 127 U/L — ABNORMAL HIGH (ref 38–126)
Anion gap: 10 (ref 5–15)
BILIRUBIN TOTAL: 0.4 mg/dL (ref 0.3–1.2)
BUN: 6 mg/dL (ref 6–20)
CHLORIDE: 102 mmol/L (ref 101–111)
CO2: 19 mmol/L — AB (ref 22–32)
Calcium: 8.8 mg/dL — ABNORMAL LOW (ref 8.9–10.3)
Creatinine, Ser: 0.71 mg/dL (ref 0.44–1.00)
GFR calc Af Amer: >60 mL/min (ref >60)
GFR calc non Af Amer: >60 mL/min (ref >60)
GLUCOSE: 83 mg/dL (ref 65–99)
POTASSIUM: 4 mmol/L (ref 3.5–5.1)
SODIUM: 131 mmol/L — AB (ref 135–145)
Total Protein: 6.2 g/dL — ABNORMAL LOW (ref 6.5–8.1)

## 2015-09-03 LAB — TYPE AND SCREEN
ABO/RH(D): A POS
ANTIBODY SCREEN: NEGATIVE

## 2015-09-03 LAB — RPR: RPR Ser Ql: NONREACTIVE

## 2015-09-03 LAB — POCT FERN TEST: POCT Fern Test: POSITIVE

## 2015-09-03 MED ORDER — OXYCODONE-ACETAMINOPHEN 5-325 MG PO TABS
2.0000 | ORAL_TABLET | Freq: Once | ORAL | Status: AC
  Administered 2015-09-03: 2 via ORAL
  Filled 2015-09-03: qty 2

## 2015-09-03 MED ORDER — LACTATED RINGERS IV SOLN
INTRAVENOUS | Status: DC
  Administered 2015-09-03 (×2): 100 mL/h via INTRAVENOUS

## 2015-09-03 MED ORDER — PRENATAL MULTIVITAMIN CH
1.0000 | ORAL_TABLET | Freq: Every day | ORAL | Status: DC
  Administered 2015-09-03 – 2015-09-06 (×4): 1 via ORAL
  Filled 2015-09-03 (×4): qty 1

## 2015-09-03 MED ORDER — ONDANSETRON HCL 4 MG/2ML IJ SOLN
4.0000 mg | Freq: Once | INTRAMUSCULAR | Status: AC
  Administered 2015-09-03: 4 mg via INTRAVENOUS
  Filled 2015-09-03: qty 2

## 2015-09-03 MED ORDER — ACETAMINOPHEN 325 MG PO TABS
650.0000 mg | ORAL_TABLET | ORAL | Status: DC | PRN
  Administered 2015-09-04: 650 mg via ORAL
  Filled 2015-09-03: qty 2

## 2015-09-03 MED ORDER — MAGNESIUM SULFATE BOLUS VIA INFUSION
6.0000 g | Freq: Once | INTRAVENOUS | Status: AC
  Administered 2015-09-03: 6 g via INTRAVENOUS
  Filled 2015-09-03: qty 500

## 2015-09-03 MED ORDER — BETAMETHASONE SOD PHOS & ACET 6 (3-3) MG/ML IJ SUSP
12.0000 mg | INTRAMUSCULAR | Status: AC
  Administered 2015-09-03 – 2015-09-04 (×2): 12 mg via INTRAMUSCULAR
  Filled 2015-09-03 (×2): qty 2

## 2015-09-03 MED ORDER — AMOXICILLIN 500 MG PO CAPS
500.0000 mg | ORAL_CAPSULE | Freq: Three times a day (TID) | ORAL | Status: DC
  Administered 2015-09-05 – 2015-09-06 (×5): 500 mg via ORAL
  Filled 2015-09-03 (×8): qty 1

## 2015-09-03 MED ORDER — LABETALOL HCL 100 MG PO TABS
100.0000 mg | ORAL_TABLET | Freq: Two times a day (BID) | ORAL | Status: DC
  Administered 2015-09-03: 100 mg via ORAL
  Filled 2015-09-03 (×2): qty 1

## 2015-09-03 MED ORDER — CALCIUM CARBONATE ANTACID 500 MG PO CHEW
2.0000 | CHEWABLE_TABLET | ORAL | Status: DC | PRN
  Administered 2015-09-05 – 2015-09-06 (×2): 400 mg via ORAL
  Filled 2015-09-03 (×2): qty 2

## 2015-09-03 MED ORDER — SODIUM CHLORIDE 0.9 % IV SOLN
2.0000 g | Freq: Four times a day (QID) | INTRAVENOUS | Status: AC
  Administered 2015-09-03 – 2015-09-04 (×7): 2 g via INTRAVENOUS
  Filled 2015-09-03 (×9): qty 2000

## 2015-09-03 MED ORDER — MAGNESIUM SULFATE 50 % IJ SOLN
2.0000 g/h | INTRAMUSCULAR | Status: DC
  Administered 2015-09-03 – 2015-09-04 (×2): 2 g/h via INTRAVENOUS
  Filled 2015-09-03 (×2): qty 80

## 2015-09-03 MED ORDER — SERTRALINE HCL 50 MG PO TABS
50.0000 mg | ORAL_TABLET | Freq: Every day | ORAL | Status: DC
  Administered 2015-09-03 – 2015-09-05 (×3): 50 mg via ORAL
  Filled 2015-09-03 (×4): qty 1

## 2015-09-03 MED ORDER — AZITHROMYCIN 250 MG PO TABS
500.0000 mg | ORAL_TABLET | Freq: Every day | ORAL | Status: DC
  Administered 2015-09-03 – 2015-09-06 (×4): 500 mg via ORAL
  Filled 2015-09-03 (×5): qty 2

## 2015-09-03 MED ORDER — ZOLPIDEM TARTRATE 5 MG PO TABS: 5.0000 mg | ORAL_TABLET | Freq: Every evening | ORAL | Status: DC | PRN

## 2015-09-03 MED ORDER — DOCUSATE SODIUM 100 MG PO CAPS
100.0000 mg | ORAL_CAPSULE | Freq: Every day | ORAL | Status: DC
  Administered 2015-09-03 – 2015-09-06 (×4): 100 mg via ORAL
  Filled 2015-09-03 (×4): qty 1

## 2015-09-03 NOTE — MAU Note (Signed)
Pt reports LOF @ [phone]

## 2015-09-03 NOTE — Progress Notes (Signed)
   Patient ID: Robin Valenzuela, female   DOB: 23-Nov-1980, 35 y.o.   MRN: [id number]   Pt with some sx's - scotomata and blurry vision.    AF 141-172/81-96 gen NAD  FHTs 130-140 mod var, baby A&B toco q  D/W MFM can monitor manage expectantly, may treat with po antihypertensive agent.  Try to get 48hr.  Can deliver if sx's worsen.

## 2015-09-03 NOTE — H&P (Signed)
Robin Valenzuela is a 35 y.o. female G2P0101 at 58 3/7 weeks (EDD 10/26/15 by 10 week [place], LMP unsure) with diamniotic/dichorionic twin gestation presenting for SROM occuring at about 0130am.  Pt feeling minimal contractions, good FM x 2.  Prenatal course has been eventful:  1) Di/Di twin pregnancy with possible hygormas x 2 identified on 10 week [place]. Followed by MFM with f/u US that initially has some soft findings of echogenic bowel and choroid plexus cysts in Twin A.  Pt had a workup which included a low risk Harmony screen, negative viral serologies and fetal ECHO x 2 WNL.  F/u US demonstrated normal concordant growth and soft findings resolved.  Last [place] 08/16/15 showed Twin A vertex 36%ile and Twin B breech 34%ile.  2) AMA Low risk Harmony 3) h/o preterm delivery at 36 weeks last pregnancy with PROM.  D/w MFM and 17-P not recommended in twin gestation.  Pt had short cervix noted 12/17 and was begun on daily vaginal progesterone.  4) 2ppd smoker, cut down to 1/2 ppd 5) Trichomonas treated in first trimester with negative TOC 6) Pt herself with h/o cleft lip and palate with repair 7)  Pt developed increased BP 08/16/15 to 140/90's.  PIH bloodwork WNL but 24 hour urine 08/16/15 showed  protein and repeat 08/30/15 showed  c/w mild preeclampsia.  She has been managed with close outpatient f/u of BP, labs and NST's 2x weekly.  She is asymptomatic. 8) h/o anxiety--stable on zoloft Maternal Medical History:  Reason for admission: Rupture of membranes.   Contractions: Onset was 3-5 hours ago.   Frequency: irregular.   Perceived severity is mild.    Fetal activity: Perceived fetal activity is normal.    Prenatal complications: Pre-eclampsia.   Prenatal Complications - Diabetes: none.    OB History    Gravida Para Term Preterm AB TAB SAB Ectopic Multiple Living   0 0 0 0 0 0 1    2014 NSVD female 6#  36 weeks  Past Medical History  Diagnosis Date  . Abnormal Pap smear   . Anxiety   .  Dichorionic diamniotic twin gestation 03/2015   Past Surgical History  Procedure Laterality Date  . Cholecystectomy    . Rhinoplasty for cleft lip / palate    . Cryotherapy    . Colposcopy    . Wisdom tooth extraction    . Cleft lip repair Left    Family History: family history is not on file. Social History:  reports that she has been smoking Cigarettes.  She has a 7.5 pack-year smoking history. She has never used smokeless tobacco. She reports that she does not drink alcohol or use illicit drugs.   Prenatal Transfer Tool  Maternal Diabetes: No Genetic Screening: Normal Maternal Ultrasounds/Referrals: Abnormal:  Findings:   Other:  First trimester soft findings of echogenic bowel and Choroid plexus cyst resolved Fetal Ultrasounds or other Referrals:  Referred to Materal Fetal Medicine  Maternal Substance Abuse:  Yes:  Type: Smoker Significant Maternal Medications:  Meds include: Zoloft Significant Maternal Lab Results:  None Other Comments:  None  Review of Systems  Gastrointestinal: Negative for abdominal pain.  Neurological: Negative for headaches.      Blood pressure 165/103, pulse 87, temperature 97.8 F (36.6 C), temperature source Oral, resp. rate 16, height  (1.753 m), weight 77.565 kg (171 lb), last menstrual period 01/25/2015, unknown if currently breastfeeding. Maternal Exam:  Uterine Assessment: Contraction strength is mild.  Contraction frequency  is irregular.   Abdomen: Patient reports no abdominal tenderness. Introitus: Normal vulva. Normal vagina.    Physical Exam  Constitutional: She is oriented to person, place, and time. She appears well-developed and well-nourished.  Cardiovascular: Normal rate and regular rhythm.   Respiratory: Effort normal.  GI: Soft.  Genitourinary: Vagina normal.  Neurological: She is alert and oriented to person, place, and time.  Psychiatric: She has a normal mood and affect.    Prenatal labs: ABO, Rh: --/--/A POS (01/23  0865) Antibody: NEG (01/23 0610) Rubella:  Immune RPR: Non Reactive (12/11 1330)  HBsAg:   Negative HIV: Non Reactive (12/11 1330)  GBS:   unknown One hour GCT 75 Harmony low risk   Assessment/Plan: Pt admitted with PPROM and no obvious labor.  Will admit to observation and hopefully prolong latency until 34 weeks.  Pt was due growth [place] this week, so will get on admission. Antibiotics after GBS and cx collected.  Pt with 24 hour urine and elevated BP c/w mild preeclampsia.  She has no severe criteria at present, so will continue to follow.  Betamethasone received over 2 weeks ago, so will repeat now. D/w pt possible tocolysis if begins to labor to gain 48 hours of steroids.  Repeat PIH labs on admission.  Oliver Pila 09/03/2015, 8:34 AM

## 2015-09-03 NOTE — Anesthesia Preprocedure Evaluation (Addendum)
Anesthesia Evaluation  Patient identified by MRN, date of birth, ID band Patient awake    Reviewed: Allergy & Precautions, H&P , NPO status , Patient's Chart, lab work & pertinent test results, reviewed documented beta blocker date and time   Airway Mallampati: III  TM Distance: >3 FB Neck ROM: Full    Dental no notable dental hx. (+) Chipped, Poor Dentition,    Pulmonary Current Smoker,    Pulmonary exam normal        Cardiovascular hypertension, Pt. on medications Normal cardiovascular exam     Neuro/Psych Anxiety negative neurological ROS  negative psych ROS   GI/Hepatic Neg liver ROS, GERD  Medicated and Controlled,  Endo/Other  negative endocrine ROS  Renal/GU Renal diseasenegative Renal ROSHx/o UTI  negative genitourinary   Musculoskeletal negative musculoskeletal ROS (+)   Abdominal Normal abdominal exam  (+)   Peds  Hematology negative hematology ROS (+) anemia ,   Anesthesia Other Findings   Reproductive/Obstetrics (+) Pregnancy Di/Di twin gestation 32 3/7 weeks SROM Twin B Breech presentation Pre eclampsia- on Rx                           Anesthesia Physical Anesthesia Plan  ASA: II  Anesthesia Plan: Spinal   Post-op Pain Management:    Induction:   Airway Management Planned: Natural Airway  Additional Equipment:   Intra-op Plan:   Post-operative Plan:   Informed Consent: I have reviewed the patients History and Physical, chart, labs and discussed the procedure including the risks, benefits and alternatives for the proposed anesthesia with the patient or authorized representative who has indicated his/her understanding and acceptance.     Plan Discussed with: Anesthesiologist, CRNA and Surgeon  Anesthesia Plan Comments:        Anesthesia Quick Evaluation

## 2015-09-04 LAB — COMPREHENSIVE METABOLIC PANEL
ALT: 9 U/L — ABNORMAL LOW (ref 14–54)
AST: 25 U/L (ref 15–41)
Albumin: 2.9 g/dL — ABNORMAL LOW (ref 3.5–5.0)
Alkaline Phosphatase: 127 U/L — ABNORMAL HIGH (ref 38–126)
Anion gap: 9 (ref 5–15)
BILIRUBIN TOTAL: 0.5 mg/dL (ref 0.3–1.2)
BUN: 8 mg/dL (ref 6–20)
CHLORIDE: 105 mmol/L (ref 101–111)
CO2: 19 mmol/L — AB (ref 22–32)
Calcium: 7.4 mg/dL — ABNORMAL LOW (ref 8.9–10.3)
Creatinine, Ser: 0.79 mg/dL (ref 0.44–1.00)
Glucose, Bld: 135 mg/dL — ABNORMAL HIGH (ref 65–99)
POTASSIUM: 3.8 mmol/L (ref 3.5–5.1)
Sodium: 133 mmol/L — ABNORMAL LOW (ref 135–145)
Total Protein: 6.8 g/dL (ref 6.5–8.1)

## 2015-09-04 LAB — CBC
HCT: 28.6 % — ABNORMAL LOW (ref 36.0–46.0)
Hemoglobin: 9.5 g/dL — ABNORMAL LOW (ref 12.0–15.0)
MCH: 29.6 pg (ref 26.0–34.0)
MCHC: 33.2 g/dL (ref 30.0–36.0)
MCV: 89.1 fL (ref 78.0–100.0)
PLATELETS: 350 10*3/uL (ref 150–400)
RBC: 3.21 MIL/uL — ABNORMAL LOW (ref 3.87–5.11)
RDW: 12.8 % (ref 11.5–15.5)
WBC: 21.2 10*3/uL — ABNORMAL HIGH (ref 4.0–10.5)

## 2015-09-04 MED ORDER — NIFEDIPINE ER OSMOTIC RELEASE 30 MG PO TB24
30.0000 mg | ORAL_TABLET | Freq: Two times a day (BID) | ORAL | Status: DC
  Administered 2015-09-04 – 2015-09-06 (×5): 30 mg via ORAL
  Filled 2015-09-04 (×6): qty 1

## 2015-09-04 NOTE — Progress Notes (Signed)
UR chart review completed.  

## 2015-09-04 NOTE — Progress Notes (Signed)
Patient ID: DERIAN PFOST, female   DOB: October 16, 1980, 35 y.o.   MRN: [id number] HD #2 PPROM di/di twins and mild preeclampsia  She was started on magnesium yesterday for seizure prophylaxis   Pt feeling terrible on the magnesium and would like to try to d/c Minimal contractions.  Some HA, but did not notice until on magnesium  afeb BP 150-160/90-100 on labetalol   BID Gravid NT  [place] yesterday showed vtx/breech twins Growth acceptable  NST reactive x 2  Will d/c continuous monitoring and magnesium and see how symptoms respond. D/w pt goal is 34 weeks for delivery unless preeclampsia becomes severe or labors/becomes infected On antibiotics, cultures I ordered were never done  For c-section with breech baby B--preterm and wants BTL Will change labetalol to procardia to see how BP does Repeat labs

## 2015-09-05 LAB — COMPREHENSIVE METABOLIC PANEL
ALBUMIN: 2.5 g/dL — AB (ref 3.5–5.0)
ALK PHOS: 118 U/L (ref 38–126)
ALT: 9 U/L — ABNORMAL LOW (ref 14–54)
AST: 25 U/L (ref 15–41)
Anion gap: 8 (ref 5–15)
BILIRUBIN TOTAL: 0.5 mg/dL (ref 0.3–1.2)
BUN: 8 mg/dL (ref 6–20)
CALCIUM: 8.3 mg/dL — AB (ref 8.9–10.3)
CO2: 21 mmol/L — ABNORMAL LOW (ref 22–32)
Chloride: 108 mmol/L (ref 101–111)
Creatinine, Ser: 0.7 mg/dL (ref 0.44–1.00)
GFR calc Af Amer: >60 mL/min (ref >60)
GLUCOSE: 118 mg/dL — AB (ref 65–99)
Potassium: 3.3 mmol/L — ABNORMAL LOW (ref 3.5–5.1)
Sodium: 137 mmol/L (ref 135–145)
TOTAL PROTEIN: 6 g/dL — AB (ref 6.5–8.1)

## 2015-09-05 LAB — CBC
HEMATOCRIT: 25.2 % — AB (ref 36.0–46.0)
HEMOGLOBIN: 8.2 g/dL — AB (ref 12.0–15.0)
MCH: 29.3 pg (ref 26.0–34.0)
MCHC: 32.5 g/dL (ref 30.0–36.0)
MCV: 90 fL (ref 78.0–100.0)
Platelets: 291 10*3/uL (ref 150–400)
RBC: 2.8 MIL/uL — ABNORMAL LOW (ref 3.87–5.11)
RDW: 12.9 % (ref 11.5–15.5)
WBC: 18.3 10*3/uL — AB (ref 4.0–10.5)

## 2015-09-05 NOTE — Progress Notes (Signed)
Patient ID: Robin Valenzuela, female   DOB: 02-27-1981, 35 y.o.   MRN: [id number] 32 5/7 weeks di/di twins PPROM vtx/ breech Preeclampsia  Pt feeling much better off magnesium, HA resolved Mild nausea, but no other sx, still with LOF No contractions  7# weight gain since admission BP about the same 140-160/75-100 on Procardia XL BID  Abdomen soft, NT  S/p betamethasone x 2 On antibiotics for 7 day course D/w pt plan is delivery with worsening preeclampsia or labor/infection--if stable will deliver at 34 weeks Daily labs

## 2015-09-06 ENCOUNTER — Encounter (HOSPITAL_COMMUNITY)
Admission: AD | Disposition: A | Discharge status: Home / Self Care | Payer: Self-pay | Source: Ambulatory Visit | Attending: Obstetrics and Gynecology

## 2015-09-06 ENCOUNTER — Inpatient Hospital Stay (HOSPITAL_COMMUNITY): Payer: Medicaid Other | Admitting: Anesthesiology

## 2015-09-06 ENCOUNTER — Encounter (HOSPITAL_COMMUNITY): Payer: Self-pay | Admitting: Anesthesiology

## 2015-09-06 LAB — TYPE AND SCREEN
ABO/RH(D): A POS
ANTIBODY SCREEN: NEGATIVE

## 2015-09-06 LAB — COMPREHENSIVE METABOLIC PANEL
ALBUMIN: 2.5 g/dL — AB (ref 3.5–5.0)
ALT: 9 U/L — ABNORMAL LOW (ref 14–54)
ANION GAP: 8 (ref 5–15)
AST: 20 U/L (ref 15–41)
Alkaline Phosphatase: 113 U/L (ref 38–126)
BILIRUBIN TOTAL: 0.6 mg/dL (ref 0.3–1.2)
BUN: 8 mg/dL (ref 6–20)
CHLORIDE: 107 mmol/L (ref 101–111)
CO2: 20 mmol/L — ABNORMAL LOW (ref 22–32)
Calcium: 8.7 mg/dL — ABNORMAL LOW (ref 8.9–10.3)
Creatinine, Ser: 0.56 mg/dL (ref 0.44–1.00)
GFR calc Af Amer: >60 mL/min (ref >60)
GFR calc non Af Amer: >60 mL/min (ref >60)
GLUCOSE: 76 mg/dL (ref 65–99)
POTASSIUM: 3.7 mmol/L (ref 3.5–5.1)
Sodium: 135 mmol/L (ref 135–145)
TOTAL PROTEIN: 5.9 g/dL — AB (ref 6.5–8.1)

## 2015-09-06 LAB — CBC
HCT: 26 % — ABNORMAL LOW (ref 36.0–46.0)
HEMATOCRIT: 28.4 % — AB (ref 36.0–46.0)
Hemoglobin: 8.4 g/dL — ABNORMAL LOW (ref 12.0–15.0)
Hemoglobin: 9.4 g/dL — ABNORMAL LOW (ref 12.0–15.0)
MCH: 29.1 pg (ref 26.0–34.0)
MCH: 29.7 pg (ref 26.0–34.0)
MCHC: 32.3 g/dL (ref 30.0–36.0)
MCHC: 33.1 g/dL (ref 30.0–36.0)
MCV: 90 fL (ref 78.0–100.0)
MCV: 89.6 fL (ref 78.0–100.0)
PLATELETS: 279 10*3/uL (ref 150–400)
Platelets: 318 10*3/uL (ref 150–400)
RBC: 2.89 MIL/uL — ABNORMAL LOW (ref 3.87–5.11)
RBC: 3.17 MIL/uL — ABNORMAL LOW (ref 3.87–5.11)
RDW: 12.9 % (ref 11.5–15.5)
RDW: 13 % (ref 11.5–15.5)
WBC: 14.7 10*3/uL — AB (ref 4.0–10.5)
WBC: 14.3 10*3/uL — AB (ref 4.0–10.5)

## 2015-09-06 SURGERY — Surgical Case
Anesthesia: Spinal

## 2015-09-06 MED ORDER — CEFAZOLIN SODIUM-DEXTROSE 2-3 GM-% IV SOLR
INTRAVENOUS | Status: AC
  Filled 2015-09-06: qty 50

## 2015-09-06 MED ORDER — CEFAZOLIN SODIUM-DEXTROSE 2-3 GM-% IV SOLR
INTRAVENOUS | Status: DC | PRN
  Administered 2015-09-06: 2 g via INTRAVENOUS

## 2015-09-06 MED ORDER — PROMETHAZINE HCL 25 MG/ML IJ SOLN: 6.2500 mg | INTRAMUSCULAR | Status: DC | PRN

## 2015-09-06 MED ORDER — PHENYLEPHRINE 8 MG IN D5W 100 ML (0.08MG/ML) PREMIX OPTIME
INJECTION | INTRAVENOUS | Status: DC | PRN
  Administered 2015-09-06: 40 ug/min via INTRAVENOUS

## 2015-09-06 MED ORDER — ONDANSETRON HCL 4 MG/2ML IJ SOLN: 4.0000 mg | Freq: Three times a day (TID) | INTRAMUSCULAR | Status: DC | PRN

## 2015-09-06 MED ORDER — MORPHINE SULFATE (PF) 0.5 MG/ML IJ SOLN
INTRAMUSCULAR | Status: DC | PRN
  Administered 2015-09-06: 4.8 mg via EPIDURAL
  Administered 2015-09-06: .2 mg via INTRATHECAL

## 2015-09-06 MED ORDER — KETOROLAC TROMETHAMINE 30 MG/ML IJ SOLN: 30.0000 mg | Freq: Once | INTRAMUSCULAR | Status: DC

## 2015-09-06 MED ORDER — SODIUM CHLORIDE 0.9 % IR SOLN
Status: DC | PRN
  Administered 2015-09-06: 1000 mL

## 2015-09-06 MED ORDER — SCOPOLAMINE 1 MG/3DAYS TD PT72: 1.0000 | MEDICATED_PATCH | Freq: Once | TRANSDERMAL | Status: DC

## 2015-09-06 MED ORDER — LACTATED RINGERS IV SOLN
INTRAVENOUS | Status: DC | PRN
  Administered 2015-09-06: 16:00:00 via INTRAVENOUS

## 2015-09-06 MED ORDER — KETOROLAC TROMETHAMINE 30 MG/ML IJ SOLN
30.0000 mg | Freq: Four times a day (QID) | INTRAMUSCULAR | Status: DC | PRN
  Filled 2015-09-06: qty 1

## 2015-09-06 MED ORDER — WITCH HAZEL-GLYCERIN EX PADS: 1.0000 "application " | MEDICATED_PAD | CUTANEOUS | Status: DC | PRN

## 2015-09-06 MED ORDER — MORPHINE SULFATE (PF) 0.5 MG/ML IJ SOLN
INTRAMUSCULAR | Status: AC
  Filled 2015-09-06: qty 10

## 2015-09-06 MED ORDER — KETOROLAC TROMETHAMINE 30 MG/ML IJ SOLN
30.0000 mg | Freq: Four times a day (QID) | INTRAMUSCULAR | Status: AC | PRN
  Filled 2015-09-06: qty 1

## 2015-09-06 MED ORDER — DIBUCAINE 1 % RE OINT: 1.0000 "application " | TOPICAL_OINTMENT | RECTAL | Status: DC | PRN

## 2015-09-06 MED ORDER — MENTHOL 3 MG MT LOZG
1.0000 | LOZENGE | OROMUCOSAL | Status: DC | PRN
  Filled 2015-09-06: qty 9

## 2015-09-06 MED ORDER — PANTOPRAZOLE SODIUM 40 MG PO TBEC
40.0000 mg | DELAYED_RELEASE_TABLET | Freq: Every day | ORAL | Status: DC
Start: 2015-09-06 — End: 2015-09-06

## 2015-09-06 MED ORDER — PRENATAL MULTIVITAMIN CH
1.0000 | ORAL_TABLET | Freq: Every day | ORAL | Status: DC
  Administered 2015-09-07 – 2015-09-08 (×2): 1 via ORAL
  Filled 2015-09-06 (×2): qty 1

## 2015-09-06 MED ORDER — LACTATED RINGERS IV SOLN
INTRAVENOUS | Status: DC
  Administered 2015-09-07: 09:00:00 via INTRAVENOUS

## 2015-09-06 MED ORDER — LACTATED RINGERS IV BOLUS (SEPSIS)
500.0000 mL | Freq: Once | INTRAVENOUS | Status: AC
  Administered 2015-09-06: 500 mL via INTRAVENOUS

## 2015-09-06 MED ORDER — NALOXONE HCL 2 MG/2ML IJ SOSY: 1.0000 ug/kg/h | PREFILLED_SYRINGE | INTRAVENOUS | Status: DC | PRN

## 2015-09-06 MED ORDER — FENTANYL CITRATE (PF) 100 MCG/2ML IJ SOLN
INTRAMUSCULAR | Status: DC | PRN
  Administered 2015-09-06: 80 ug via INTRAVENOUS
  Administered 2015-09-06: 20 ug via INTRATHECAL

## 2015-09-06 MED ORDER — NALBUPHINE HCL 10 MG/ML IJ SOLN
INTRAMUSCULAR | Status: AC
  Filled 2015-09-06: qty 1

## 2015-09-06 MED ORDER — SERTRALINE HCL 50 MG PO TABS
50.0000 mg | ORAL_TABLET | Freq: Every day | ORAL | Status: DC
  Administered 2015-09-06 – 2015-09-07 (×2): 50 mg via ORAL
  Filled 2015-09-06 (×3): qty 1

## 2015-09-06 MED ORDER — CITRIC ACID-SODIUM CITRATE 334-500 MG/5ML PO SOLN
ORAL | Status: AC
  Filled 2015-09-06: qty 15

## 2015-09-06 MED ORDER — SIMETHICONE 80 MG PO CHEW: 80.0000 mg | CHEWABLE_TABLET | ORAL | Status: DC | PRN

## 2015-09-06 MED ORDER — MAGNESIUM SULFATE 50 % IJ SOLN
2.0000 g/h | INTRAVENOUS | Status: DC
  Filled 2015-09-06: qty 80

## 2015-09-06 MED ORDER — OXYTOCIN 10 UNIT/ML IJ SOLN
INTRAMUSCULAR | Status: AC
  Filled 2015-09-06: qty 4

## 2015-09-06 MED ORDER — BUPIVACAINE IN DEXTROSE 0.75-8.25 % IT SOLN
INTRATHECAL | Status: DC | PRN
  Administered 2015-09-06: 10.5 mg via INTRATHECAL

## 2015-09-06 MED ORDER — SIMETHICONE 80 MG PO CHEW
80.0000 mg | CHEWABLE_TABLET | ORAL | Status: DC
  Administered 2015-09-06: 80 mg via ORAL
  Filled 2015-09-06 (×2): qty 1

## 2015-09-06 MED ORDER — KETOROLAC TROMETHAMINE 30 MG/ML IJ SOLN
30.0000 mg | Freq: Four times a day (QID) | INTRAMUSCULAR | Status: DC | PRN
  Administered 2015-09-06: 30 mg via INTRAMUSCULAR
  Filled 2015-09-06: qty 1

## 2015-09-06 MED ORDER — ACETAMINOPHEN 325 MG PO TABS: 650.0000 mg | ORAL_TABLET | ORAL | Status: DC | PRN

## 2015-09-06 MED ORDER — LANOLIN HYDROUS EX OINT: 1.0000 "application " | TOPICAL_OINTMENT | CUTANEOUS | Status: DC | PRN

## 2015-09-06 MED ORDER — HYDROMORPHONE HCL 1 MG/ML IJ SOLN: 0.2500 mg | INTRAMUSCULAR | Status: DC | PRN

## 2015-09-06 MED ORDER — DEXAMETHASONE SODIUM PHOSPHATE 10 MG/ML IJ SOLN
INTRAMUSCULAR | Status: AC
  Filled 2015-09-06: qty 1

## 2015-09-06 MED ORDER — KETOROLAC TROMETHAMINE 30 MG/ML IJ SOLN
INTRAMUSCULAR | Status: AC
  Filled 2015-09-06: qty 1

## 2015-09-06 MED ORDER — HYDRALAZINE HCL 20 MG/ML IJ SOLN: 10.0000 mg | Freq: Once | INTRAMUSCULAR | Status: DC | PRN

## 2015-09-06 MED ORDER — DIPHENHYDRAMINE HCL 25 MG PO CAPS: 25.0000 mg | ORAL_CAPSULE | ORAL | Status: DC | PRN

## 2015-09-06 MED ORDER — DIPHENHYDRAMINE HCL 25 MG PO CAPS: 25.0000 mg | ORAL_CAPSULE | Freq: Four times a day (QID) | ORAL | Status: DC | PRN

## 2015-09-06 MED ORDER — PHENYLEPHRINE 8 MG IN D5W 100 ML (0.08MG/ML) PREMIX OPTIME
INJECTION | INTRAVENOUS | Status: AC
  Filled 2015-09-06: qty 100

## 2015-09-06 MED ORDER — DEXAMETHASONE SODIUM PHOSPHATE 10 MG/ML IJ SOLN
INTRAMUSCULAR | Status: DC | PRN
  Administered 2015-09-06: 10 mg via INTRAVENOUS

## 2015-09-06 MED ORDER — NALBUPHINE HCL 10 MG/ML IJ SOLN: 5.0000 mg | INTRAMUSCULAR | Status: DC | PRN

## 2015-09-06 MED ORDER — SIMETHICONE 80 MG PO CHEW
80.0000 mg | CHEWABLE_TABLET | Freq: Three times a day (TID) | ORAL | Status: DC
  Administered 2015-09-07 – 2015-09-08 (×4): 80 mg via ORAL
  Filled 2015-09-06 (×4): qty 1

## 2015-09-06 MED ORDER — NALOXONE HCL 0.4 MG/ML IJ SOLN: 0.4000 mg | INTRAMUSCULAR | Status: DC | PRN

## 2015-09-06 MED ORDER — SENNOSIDES-DOCUSATE SODIUM 8.6-50 MG PO TABS
2.0000 | ORAL_TABLET | ORAL | Status: DC
  Administered 2015-09-06: 2 via ORAL
  Filled 2015-09-06 (×2): qty 2

## 2015-09-06 MED ORDER — ONDANSETRON HCL 4 MG/2ML IJ SOLN
INTRAMUSCULAR | Status: AC
  Filled 2015-09-06: qty 2

## 2015-09-06 MED ORDER — MEPERIDINE HCL 25 MG/ML IJ SOLN
INTRAMUSCULAR | Status: AC
  Filled 2015-09-06: qty 1

## 2015-09-06 MED ORDER — OXYTOCIN 10 UNIT/ML IJ SOLN: 2.5000 [IU]/h | INTRAVENOUS | Status: AC

## 2015-09-06 MED ORDER — MAGNESIUM SULFATE BOLUS VIA INFUSION
4.0000 g | Freq: Once | INTRAVENOUS | Status: AC
  Administered 2015-09-06: 4 g via INTRAVENOUS
  Filled 2015-09-06: qty 500

## 2015-09-06 MED ORDER — ONDANSETRON HCL 4 MG/2ML IJ SOLN
INTRAMUSCULAR | Status: DC | PRN
  Administered 2015-09-06: 4 mg via INTRAVENOUS

## 2015-09-06 MED ORDER — MEPERIDINE HCL 25 MG/ML IJ SOLN: 6.2500 mg | INTRAMUSCULAR | Status: DC | PRN

## 2015-09-06 MED ORDER — MEPERIDINE HCL 25 MG/ML IJ SOLN
INTRAMUSCULAR | Status: DC | PRN
  Administered 2015-09-06: 12.5 mg via INTRAVENOUS

## 2015-09-06 MED ORDER — SCOPOLAMINE 1 MG/3DAYS TD PT72
MEDICATED_PATCH | TRANSDERMAL | Status: AC
  Filled 2015-09-06: qty 1

## 2015-09-06 MED ORDER — IBUPROFEN 600 MG PO TABS
600.0000 mg | ORAL_TABLET | Freq: Four times a day (QID) | ORAL | Status: DC
  Administered 2015-09-06 – 2015-09-08 (×7): 600 mg via ORAL
  Filled 2015-09-06 (×7): qty 1

## 2015-09-06 MED ORDER — SODIUM CHLORIDE 0.45 % IV SOLN
INTRAVENOUS | Status: DC
  Administered 2015-09-06: 19:00:00 via INTRAVENOUS

## 2015-09-06 MED ORDER — NALBUPHINE HCL 10 MG/ML IJ SOLN: 5.0000 mg | Freq: Once | INTRAMUSCULAR | Status: AC | PRN

## 2015-09-06 MED ORDER — ZOLPIDEM TARTRATE 5 MG PO TABS
5.0000 mg | ORAL_TABLET | Freq: Every evening | ORAL | Status: DC | PRN
  Administered 2015-09-07: 5 mg via ORAL
  Filled 2015-09-06: qty 1

## 2015-09-06 MED ORDER — IBUPROFEN 600 MG PO TABS: 600.0000 mg | ORAL_TABLET | Freq: Four times a day (QID) | ORAL | Status: DC | PRN

## 2015-09-06 MED ORDER — DIPHENHYDRAMINE HCL 50 MG/ML IJ SOLN
12.5000 mg | INTRAMUSCULAR | Status: DC | PRN
  Administered 2015-09-06: 12.5 mg via INTRAVENOUS
  Filled 2015-09-06: qty 1

## 2015-09-06 MED ORDER — LABETALOL HCL 5 MG/ML IV SOLN: 20.0000 mg | INTRAVENOUS | Status: DC | PRN

## 2015-09-06 MED ORDER — SODIUM CHLORIDE 0.9% FLUSH: 3.0000 mL | INTRAVENOUS | Status: DC | PRN

## 2015-09-06 MED ORDER — OXYCODONE HCL 5 MG PO TABS: 10.0000 mg | ORAL_TABLET | ORAL | Status: DC | PRN

## 2015-09-06 MED ORDER — TETANUS-DIPHTH-ACELL PERTUSSIS 5-2.5-18.5 LF-MCG/0.5 IM SUSP: 0.5000 mL | Freq: Once | INTRAMUSCULAR | Status: DC

## 2015-09-06 MED ORDER — NALBUPHINE HCL 10 MG/ML IJ SOLN
5.0000 mg | Freq: Once | INTRAMUSCULAR | Status: AC | PRN
  Administered 2015-09-06: 5 mg via INTRAVENOUS

## 2015-09-06 MED ORDER — OXYTOCIN 10 UNIT/ML IJ SOLN
40.0000 [IU] | INTRAVENOUS | Status: DC | PRN
  Administered 2015-09-06: 40 [IU] via INTRAVENOUS

## 2015-09-06 MED ORDER — ACETAMINOPHEN 500 MG PO TABS
1000.0000 mg | ORAL_TABLET | Freq: Four times a day (QID) | ORAL | Status: AC
  Administered 2015-09-06 – 2015-09-07 (×3): 1000 mg via ORAL
  Filled 2015-09-06 (×3): qty 2

## 2015-09-06 MED ORDER — FENTANYL CITRATE (PF) 100 MCG/2ML IJ SOLN
INTRAMUSCULAR | Status: AC
  Filled 2015-09-06: qty 2

## 2015-09-06 MED ORDER — OXYCODONE HCL 5 MG PO TABS
5.0000 mg | ORAL_TABLET | ORAL | Status: DC | PRN
  Administered 2015-09-07 – 2015-09-08 (×3): 5 mg via ORAL
  Filled 2015-09-06 (×3): qty 1

## 2015-09-06 SURGICAL SUPPLY — 42 items
BENZOIN TINCTURE PRP APPL 2/3 (GAUZE/BANDAGES/DRESSINGS) ×3 IMPLANT
CLAMP CORD UMBIL (MISCELLANEOUS) ×9 IMPLANT
CLOSURE STERI-STRIP 1/2X4 (GAUZE/BANDAGES/DRESSINGS) ×1
CLOSURE WOUND 1/2 X4 (GAUZE/BANDAGES/DRESSINGS)
CLOTH BEACON ORANGE TIMEOUT ST (SAFETY) ×3 IMPLANT
CLSR STERI-STRIP ANTIMIC 1/2X4 (GAUZE/BANDAGES/DRESSINGS) ×2 IMPLANT
DRAPE SHEET LG 3/4 BI-LAMINATE (DRAPES) IMPLANT
DRSG OPSITE POSTOP 4X10 (GAUZE/BANDAGES/DRESSINGS) ×3 IMPLANT
DURAPREP 26ML APPLICATOR (WOUND CARE) ×3 IMPLANT
ELECT REM PT RETURN 9FT ADLT (ELECTROSURGICAL) ×3
ELECTRODE REM PT RTRN 9FT ADLT (ELECTROSURGICAL) ×1 IMPLANT
EXTRACTOR VACUUM KIWI (MISCELLANEOUS) IMPLANT
GLOVE BIO SURGEON STRL SZ 6.5 (GLOVE) ×4 IMPLANT
GLOVE BIO SURGEONS STRL SZ 6.5 (GLOVE) ×2
GLOVE BIOGEL PI IND STRL 7.0 (GLOVE) ×1 IMPLANT
GLOVE BIOGEL PI INDICATOR 7.0 (GLOVE) ×2
GOWN STRL REUS W/TWL LRG LVL3 (GOWN DISPOSABLE) ×6 IMPLANT
KIT ABG SYR 3ML LUER SLIP (SYRINGE) IMPLANT
NDL SAFETY ECLIPSE 18X1.5 (NEEDLE) ×1 IMPLANT
NEEDLE HYPO 18GX1.5 SHARP (NEEDLE) ×2
NEEDLE HYPO 25X5/8 SAFETYGLIDE (NEEDLE) IMPLANT
NS IRRIG 1000ML POUR BTL (IV SOLUTION) ×3 IMPLANT
PACK C SECTION WH (CUSTOM PROCEDURE TRAY) ×3 IMPLANT
PAD OB MATERNITY 4.3X12.25 (PERSONAL CARE ITEMS) ×3 IMPLANT
PENCIL SMOKE EVAC W/HOLSTER (ELECTROSURGICAL) ×3 IMPLANT
RTRCTR C-SECT PINK 25CM LRG (MISCELLANEOUS) ×3 IMPLANT
STRIP CLOSURE SKIN 1/2X4 (GAUZE/BANDAGES/DRESSINGS) IMPLANT
SUT CHROMIC 1 CTX 36 (SUTURE) ×6 IMPLANT
SUT PLAIN 0 NONE (SUTURE) IMPLANT
SUT PLAIN 2 0 (SUTURE) ×2
SUT PLAIN 2 0 XLH (SUTURE) ×3 IMPLANT
SUT PLAIN ABS 2-0 CT1 27XMFL (SUTURE) ×1 IMPLANT
SUT VIC AB 0 CT1 27 (SUTURE) ×6
SUT VIC AB 0 CT1 27XBRD ANBCTR (SUTURE) ×3 IMPLANT
SUT VIC AB 2-0 CT1 27 (SUTURE) ×4
SUT VIC AB 2-0 CT1 TAPERPNT 27 (SUTURE) ×2 IMPLANT
SUT VIC AB 3-0 CT1 27 (SUTURE)
SUT VIC AB 3-0 CT1 TAPERPNT 27 (SUTURE) IMPLANT
SUT VIC AB 4-0 KS 27 (SUTURE) ×3 IMPLANT
SYRINGE 10CC LL (SYRINGE) ×3 IMPLANT
TOWEL OR 17X24 6PK STRL BLUE (TOWEL DISPOSABLE) ×3 IMPLANT
TRAY FOLEY CATH SILVER 14FR (SET/KITS/TRAYS/PACK) ×3 IMPLANT

## 2015-09-06 NOTE — Transfer of Care (Signed)
Immediate Anesthesia Transfer of Care Note  Patient: Robin Valenzuela  Procedure(s) Performed: Procedure(s): CESAREAN SECTION (N/A)  Patient Location: PACU  Anesthesia Type:Spinal  Level of Consciousness: awake, alert , oriented and patient cooperative  Airway & Oxygen Therapy: Patient Spontanous Breathing  Post-op Assessment: Report given to RN and Post -op Vital signs reviewed and stable  Post vital signs: Reviewed and stable  Last Vitals:  Filed Vitals:   09/06/15 1728 09/06/15 1729  BP:    Pulse: 73 74  Temp:    Resp: 11 16    Complications: No apparent anesthesia complications

## 2015-09-06 NOTE — Anesthesia Procedure Notes (Signed)
Spinal Patient location during procedure: OR Start time: 09/06/2015 4:01 PM End time: 09/06/2015 4:04 PM Staffing Anesthesiologist: Leilani Able Performed by: anesthesiologist  Preanesthetic Checklist Completed: patient identified, surgical consent, pre-op evaluation, timeout performed, IV checked, risks and benefits discussed and monitors and equipment checked Spinal Block Patient position: sitting Prep: DuraPrep Patient monitoring: heart rate, cardiac monitor, continuous pulse ox and blood pressure Approach: midline Location: L3-4 Injection technique: single-shot Needle Needle type: Sprotte  Needle gauge: 24 G Needle length: 9 cm Needle insertion depth: 5 cm Assessment Sensory level: T4

## 2015-09-06 NOTE — Progress Notes (Signed)
Patient ID: Robin Valenzuela, female   DOB: September 28, 1980, 35 y.o.   MRN: [id number] CTSP for onset of contractions  Pt c/o contractions 6/10 every 3 minutes Cervix was 2cm and now 3-4/80 -2 FHR Category 1 x 2, ctx q 3-4 min  CBC this AM was WNL platelets 279K All other labs WNL Bedside US confirms vertex/breech  Pt counseled again on risks and benefits of c-section including bleeding, infection and possible damage to bowel and bladder.  Understands Better for the babies since B remains breech and preterm.  Pt also wants permanent sterilization irregardless of issues with babies being preterm. Understands risk of failure of 1/100.   Ready to proceed. NICU to be contacted by OR nurse.  Anesthesia and OR aware and setting up Will follow BP pp and decide if needs magnesium

## 2015-09-06 NOTE — Op Note (Signed)
Operative Note    Preoperative Diagnosis Preterm twin pregnancy at 73 6/7 weeks PPROM with onset of labor Preeclampsia Vertex/ breech presentation  Postoperative Diagnosis same   Procedure Primary low transverse c-section with 2 layer closure of uterus Bilateral partial salpingectomy  Surgeon Huel Cote  Anesthesia Spinal  Fluids: EBL  1000cc UOP 200cc IVF 1200cc LR  Findings Viable female infant twin B in vertex position--Apgars 8.9 Weight 3#8oz  Twin B footling breech Apgars 8,8 and weight 3#6oz Normal uterus tubes and ovaries.  Loose gall bladder clip adherent to mesentary and removed  Specimen Placenta x 2 Right and left fallopian tube segments  Procedure Note Procedure note  Patient was taken to the operating room where spinal anesthesia was obtained and  found to be adequate by Allis clamp test. She was prepped and draped in the normal sterile fashion in the dorsal supine position with a leftward tilt. An appropriate time out was performed. A Pfannenstiel skin incision was then made with the scalpel and carried through to the underlying layer of fascia by sharp dissection and Bovie cautery. The fascia was nicked in the midline and the incision was extended laterally with Mayo scissors. The inferior aspect of the incision was grasped Coker clamps and dissected off the underlying rectus muscles. In a similar fashion the superior aspect was dissected off the rectus muscles. Rectus muscles were separated in the midline and the peritoneal cavity entered bluntly. The peritoneal incision was then extended both superiorly and inferiorly with careful attention to avoid both bowel and bladder. The Alexis self-retaining wound retractor was then placed within the incision and the lower uterine segment exposed. The bladder flap was developed with Metzenbaum scissors and pushed away from the lower uterine segment. The lower uterine segment was then incised in a transverse fashion  and the cavity itself entered bluntly. The incision was extended bluntly. Twin A's  head was then lifted and delivered from the incision without difficulty. The remainder of the infant delivered and the nose and mouth bulb suctioned with the cord clamped and cut as well. The infant was handed off to the waiting pediatricians.  Twin B was then noted to be footling breech.  The bag was ruptured and the feet grasped with the body delivered with gentle traction.  Arms were reduced over the chest and head delivered flexed.  Twin B was then handed to pediatricians after cord clamped and cut. The placenta was then spontaneously expressed from the uterus and the uterus cleared of all clots and debris with moist lap sponge. The uterine incision was then repaired in 2 layers the first layer was a running locked layer 1-0 chromic and the second an imbricating layer of the same suture. The tubes and ovaries were inspected and the gutters cleared of all clots and debris. The uterine incision was inspected and found to be hemostatic.   The fallopian tubes were then identified bilaterally and trace d to their fimbriated end. Each tube was grasped in a Babcock clamp and a 2-3 cm segment of tube tied off with 2 free ties of 0 plain and then amputed.  The pedicle was additionally cauterized and hemostatic bilaterally.  All instruments and sponges as well as the Alexis retractor were then removed from the abdomen. The rectus muscles and peritoneum were then reapproximated with a continuous suture of 2-0 Vicryl. The fascia was then closed with 0 Vicryl in a running fashion, and the right corner reinforced with a second 0-vicryl suture.  Subcutaneous tissue  was reapproximated with 3-0 plain in a running fashion. The skin was closed with a subcuticular stitch of 4-0 Vicryl on a Keith needle and then reinforced with benzoin and Steri-Strips. At the conclusion of the procedure all instruments and sponge counts were correct. Patient was  taken to the recovery room in good condition and the babies went to the NICU in good condition

## 2015-09-06 NOTE — Addendum Note (Signed)
Addendum  created 09/06/15 2030 by Shanon Payor, CRNA   Modules edited: Anesthesia Responsible Staff

## 2015-09-06 NOTE — Anesthesia Postprocedure Evaluation (Signed)
Anesthesia Post Note  Patient: Robin Valenzuela  Procedure(s) Performed: Procedure(s) (LRB): CESAREAN SECTION (N/A)  Patient location during evaluation: PACU Anesthesia Type: Spinal Level of consciousness: awake Pain management: pain level controlled Vital Signs Assessment: post-procedure vital signs reviewed and stable Respiratory status: spontaneous breathing Cardiovascular status: stable Postop Assessment: no headache, no backache, spinal receding, patient able to bend at knees and no signs of nausea or vomiting Anesthetic complications: no    Last Vitals:  Filed Vitals:   09/06/15 1728 09/06/15 1729  BP:    Pulse: 73 74  Temp:    Resp: 11 16    Last Pain:  Filed Vitals:   09/06/15 1734  PainSc: 8                  Alitza Cowman JR,JOHN Tatem Holsonback

## 2015-09-06 NOTE — Progress Notes (Signed)
Patient ID: Robin Valenzuela, female   DOB: [birth year], 35 y.o.   MRN: [id number] 32 6/7 weeks di/di twins PPROM vtx/ breech Preeclampsia  Pt feels well, some mild nausea in Am before eats, prob reflux No HA  Good FM  BP 140-150/80-90's on Procardia XL  BID 4# weight gain since yesterday PIH labs remain WNL today Gravid NT    S/p betamethasone x 2 Still on 7 day course of antibiotics, GBS + in urine 12/16 Vtx/breech on admission [place], so plan is delivery by c-section with concurrent BTL unless Baby B changes position Deliver for labor/infection or worsening preeclampsia

## 2015-09-07 ENCOUNTER — Encounter (HOSPITAL_COMMUNITY): Payer: Self-pay | Admitting: Obstetrics and Gynecology

## 2015-09-07 LAB — CBC
HEMATOCRIT: 25.3 % — AB (ref 36.0–46.0)
HEMOGLOBIN: 8.3 g/dL — AB (ref 12.0–15.0)
MCH: 29.1 pg (ref 26.0–34.0)
MCHC: 32.8 g/dL (ref 30.0–36.0)
MCV: 88.8 fL (ref 78.0–100.0)
Platelets: 395 10*3/uL (ref 150–400)
RBC: 2.85 MIL/uL — AB (ref 3.87–5.11)
RDW: 12.8 % (ref 11.5–15.5)
WBC: 25.1 10*3/uL — AB (ref 4.0–10.5)

## 2015-09-07 LAB — COMPREHENSIVE METABOLIC PANEL
ALT: 13 U/L — ABNORMAL LOW (ref 14–54)
AST: 26 U/L (ref 15–41)
Albumin: 2.7 g/dL — ABNORMAL LOW (ref 3.5–5.0)
Alkaline Phosphatase: 113 U/L (ref 38–126)
Anion gap: 9 (ref 5–15)
BUN: 8 mg/dL (ref 6–20)
CHLORIDE: 103 mmol/L (ref 101–111)
CO2: 23 mmol/L (ref 22–32)
Calcium: 8.3 mg/dL — ABNORMAL LOW (ref 8.9–10.3)
Creatinine, Ser: 0.66 mg/dL (ref 0.44–1.00)
Glucose, Bld: 95 mg/dL (ref 65–99)
POTASSIUM: 4.8 mmol/L (ref 3.5–5.1)
Sodium: 135 mmol/L (ref 135–145)
Total Bilirubin: 0.3 mg/dL (ref 0.3–1.2)
Total Protein: 6 g/dL — ABNORMAL LOW (ref 6.5–8.1)

## 2015-09-07 MED ORDER — PNEUMOCOCCAL VAC POLYVALENT 25 MCG/0.5ML IJ INJ
0.5000 mL | INJECTION | INTRAMUSCULAR | Status: AC
  Administered 2015-09-08: 0.5 mL via INTRAMUSCULAR
  Filled 2015-09-07: qty 0.5

## 2015-09-07 MED ORDER — NIFEDIPINE ER 30 MG PO TB24
30.0000 mg | ORAL_TABLET | Freq: Every day | ORAL | Status: DC
  Administered 2015-09-07 – 2015-09-08 (×2): 30 mg via ORAL
  Filled 2015-09-07 (×3): qty 1

## 2015-09-07 NOTE — Anesthesia Postprocedure Evaluation (Signed)
Anesthesia Post Note  Patient: Robin Valenzuela  Procedure(s) Performed: Procedure(s) (LRB): CESAREAN SECTION (N/A)  Patient location during evaluation: A-ICU Anesthesia Type: Spinal Level of consciousness: awake and alert and oriented Pain management: pain level controlled Vital Signs Assessment: post-procedure vital signs reviewed and stable Respiratory status: spontaneous breathing Cardiovascular status: blood pressure returned to baseline Postop Assessment: no headache, no backache, patient able to bend at knees, no signs of nausea or vomiting and adequate PO intake Anesthetic complications: no    Last Vitals:  Filed Vitals:   09/07/15 0700 09/07/15 0900  BP: 151/94   Pulse: 103   Temp:  36.6 C  Resp: 18     Last Pain:  Filed Vitals:   09/07/15 0923  PainSc: 0-No pain                 [name]

## 2015-09-07 NOTE — Progress Notes (Signed)
Pt has had several BPs in severe range, but within minutes BP is below parameters for IV antihypertensives.  Pt's UO 1550 ml since foley catheter removed at 5:30am.  Pt's DTRs are WNL and she has no clonus, headache, visual disturbances, or epigastric pain.  Dr. Senaida Ores mentioned during morning rounds that pt might be restarted on Procardia sometime this evening.  Phoned Dr. Ellyn Hack with above information and for guidance as to whether to proceed with IV antihypertensives.  See orders to discontinue magnesium at 15:00 and begin Procardia at 16:00.

## 2015-09-07 NOTE — Progress Notes (Addendum)
Initial visit with patient who has twin boys in NICU.  Introduced spiritual care services and the support we can provide for mother and her boys during their hospital stay and after.  MOB shared that she is eager to see them and hasn't been able to visit much.  She has some anxiety about choosing names for the boys because her family is giving her a hard time, but shared that she wants to know them a bit before deciding.  She shared that she has some worry that the babies will get mixed up without names, but is comforted by the idea that they're getting different treatment and hopes that will help them be identified differently.  Chaplain reminded her that they also have different identifiers on their tabs and the staff will take great care to treat them individually.  I encouraged her to take the time she needs and not feel rushed by others' need for information.  WIll continue to follow.  Please page as further needs arise.  Maryanna Shape. Carley Hammed, M.Div. [facility] Chaplain Pager [phone] Office [phone]       09/07/15 1238  Clinical Encounter Type  Visited With Patient  Visit Type Initial  Referral From Chaplain  Spiritual Encounters  Spiritual Needs Emotional

## 2015-09-07 NOTE — Progress Notes (Signed)
Subjective: Postpartum Day 1 Cesarean Delivery Patient reports tolerating PO and no problems voiding.  Pt reports minimal pain.  She has no PIH symptoms, feels good overall   Objective: Vital signs in last 24 hours: Temp:  [97.6 F (36.4 C)-98.4 F (36.9 C)] 97.7 F (36.5 C) (01/27 0327) Pulse Rate:  [68-111] 103 (01/27 0700) Resp:  [11-21] 18 (01/27 0700) BP: (138-180)/(80-114) 151/94 mmHg (01/27 0700) SpO2:  [94 %-100 %] 100 % (01/27 0700) Weight:  [74.753 kg (164 lb 12.8 oz)] 74.753 kg (164 lb 12.8 oz) (01/27 0654)  Excellent diuresis  3000+ml in last shift  Physical Exam:  General: alert and cooperative Lochia: appropriate Uterine Fundus: firm Incision: C/D/I    Recent Labs  09/06/15 1510 09/07/15 0544  HGB 9.4* 8.3*  HCT 28.4* 25.3*  PIH labs WNL, hgb with appropriate drop  Assessment/Plan: Status post Cesarean section. Doing well postoperatively.  Will continue magnesium until this PM then can likely d/c since labs stable and diuresing well BP still elevated so will transition back to po procardia once off magnesium XL BID and titrate up as needed Babies stable in NICU Twin A on room air and B on CPAP  Adalee Kathan W 09/07/2015, 8:42 AM

## 2015-09-07 NOTE — Addendum Note (Signed)
Addendum  created 09/07/15 1005 by Jhonnie Garner, CRNA   Modules edited: Clinical Notes   Clinical Notes:  File: [id number]

## 2015-09-08 MED ORDER — IBUPROFEN 800 MG PO TABS: 800.0000 mg | ORAL_TABLET | Freq: Three times a day (TID) | ORAL | Status: DC | PRN

## 2015-09-08 MED ORDER — OXYCODONE HCL 5 MG PO TABS: 5.0000 mg | ORAL_TABLET | Freq: Four times a day (QID) | ORAL | Status: DC | PRN

## 2015-09-08 MED ORDER — NIFEDIPINE ER 60 MG PO TB24: 60.0000 mg | ORAL_TABLET | Freq: Every day | ORAL | Status: DC

## 2015-09-08 MED ORDER — NIFEDIPINE ER 60 MG PO TB24
60.0000 mg | ORAL_TABLET | Freq: Every day | ORAL | Status: DC
  Filled 2015-09-08: qty 1

## 2015-09-08 MED ORDER — NIFEDIPINE ER 30 MG PO TB24
30.0000 mg | ORAL_TABLET | Freq: Once | ORAL | Status: AC
  Administered 2015-09-08: 30 mg via ORAL
  Filled 2015-09-08: qty 1

## 2015-09-08 MED ORDER — PRENATAL MULTIVITAMIN CH: 1.0000 | ORAL_TABLET | Freq: Every day | ORAL | Status: DC

## 2015-09-08 MED ORDER — NIFEDIPINE ER 30 MG PO TB24: 30.0000 mg | ORAL_TABLET | Freq: Every day | ORAL | Status: DC

## 2015-09-08 NOTE — Progress Notes (Signed)
   09/08/15 1300  Provider Notification  Reason for Communication Review Case (BP & HR after activity)  Provider Name B[facility]ard  Provider Role Attending physician  Method of Communication Call  Response Other (Comment) (MD to consult w/pharmacy & call back)

## 2015-09-08 NOTE — Progress Notes (Addendum)
Subjective: Postpartum Day 2: Cesarean Delivery Patient reports incisional pain, tolerating PO and no problems voiding.    Objective: Vital signs in last 24 hours: Temp:  [97.4 F (36.3 C)-98.2 F (36.8 C)] 98.2 F (36.8 C) (01/28 0027) Pulse Rate:  [94-111] 98 (01/28 0548) Resp:  [18-20] 20 (01/28 0548) BP: (134-174)/(85-106) 145/93 mmHg (01/28 0548) SpO2:  [96 %-100 %] 99 % (01/28 0548) Weight:  [72.031 kg (158 lb 12.8 oz)] 72.031 kg (158 lb 12.8 oz) (01/28 0548)  Physical Exam:  General: alert and no distress Lochia: appropriate Uterine Fundus: firm Incision: healing well DVT Evaluation: No evidence of DVT seen on physical exam.   Recent Labs  09/06/15 1510 09/07/15 0544  HGB 9.4* 8.3*  HCT 28.4* 25.3*    Assessment/Plan: Status post Cesarean section. Doing well postoperatively.  Discharge home with standard precautions and return to clinic in 2 weeks. Discharge at pt's request.  D/C with Motrin, percocet, Procardia XL and PNV.  F/U in 2 weeks  Robin Valenzuela, Robin Valenzuela 09/08/2015, 8:07 AM

## 2015-09-08 NOTE — Discharge Summary (Addendum)
OB Discharge Summary     Patient Name: Robin Valenzuela DOB: Mar 30, 1981 MRN: [id number]  Date of admission: 09/03/2015 Delivering MD:    Aline, Wesche [[id number]]  Cabella, Kimm [[id number]]  Huel Cote   Date of discharge: 09/08/2015  Admitting diagnosis: TWINS 32 WEEKS ROM Intrauterine pregnancy: [redacted]w[redacted]d     Secondary diagnosis:  Active Problems:   Premature rupture of membranes  Additional problems: PIH, PTL     Discharge diagnosis: Preterm Pregnancy Delivered and twin IUP, PIH                                                                                                Post partum procedures:postpartum tubal ligation and Magnesium Sulfate PP  Augmentation: none  Complications: None  Hospital course:  Onset of Labor With Unplanned C/S  35 y.o. yo G2P1103 at [redacted]w[redacted]d was admitted in Latent Labor on 09/03/2015. Patient had a labor course significant for SROM. Membrane Rupture Time/Date:    Robin, Valenzuela [[id number]]  1:15 AM   Robin, Valenzuela [[id number]]  4:25 PM ,   Robin, Valenzuela [[id number]]  09/03/2015   Robin, Valenzuela [[id number]]  09/03/2015   The patient went for cesarean section due to [facility], and delivered a Viable infants    Robin, Valenzuela [[id number]]  09/06/2015   Robin, [name] [[id number]]  09/06/2015  Details of operation can be found in separate operative note. Patient had an uncomplicated postpartum course.  She is ambulating,tolerating a regular diet, passing flatus, and urinating well.  Patient is discharged home in stable condition 09/08/2015.  Physical exam  Filed Vitals:   09/08/15 0406 09/08/15 0548 09/08/15 0819 09/08/15 0820  BP:  145/93 147/105 155/103  Pulse:  98  97  Temp:    98.6 F (37 C)  TempSrc:    Oral  Resp: Height:      Weight:  72.031 kg (158 lb 12.8 oz)    SpO2:  99%  98%   General: alert and no distress Lochia: appropriate Uterine Fundus:  firm Incision: Healing well with no significant drainage DVT Evaluation: No evidence of DVT seen on physical exam. Labs: Lab Results  Component Value Date   WBC 25.1* 09/07/2015   HGB 8.3* 09/07/2015   HCT 25.3* 09/07/2015   MCV 88.8 09/07/2015   PLT 395 09/07/2015   CMP Latest Ref Rng 09/07/2015  Glucose 65 - 99 mg/dL 95  BUN 6 - 20 mg/dL 8  Creatinine 8.31 - 5.17 mg/dL 6.16  Sodium 073 - 710 mmol/L 135  Potassium 3.5 - 5.1 mmol/L 4.8  Chloride 101 - 111 mmol/L 103  CO2 22 - 32 mmol/L 23  Calcium 8.9 - 10.3 mg/dL 8.3(L)  Total Protein 6.5 - 8.1 g/dL 6.0(L)  Total Bilirubin 0.3 - 1.2 mg/dL 0.3  Alkaline Phos 38 - 126 U/L 113  AST 15 - 41 U/L 26  ALT 14 - 54 U/L 13(L)    Discharge instruction: per After Visit Summary and "Baby and Me Booklet".  After visit meds:  Medication List    TAKE these medications        ibuprofen 800 MG tablet  Commonly known as:  ADVIL,MOTRIN  Take 1 tablet (800 mg total) by mouth every 8 (eight) hours as needed.     NIFEdipine 30 MG 24 hr tablet  Commonly known as:  PROCARDIA-XL/ADALAT CC  Take 1 tablet (30 mg total) by mouth daily.     oxyCODONE 5 MG immediate release tablet  Commonly known as:  Oxy IR/ROXICODONE  Take 1 tablet (5 mg total) by mouth every 6 (six) hours as needed for severe pain.     prenatal multivitamin Tabs tablet  Take 1 tablet by mouth daily at 12 noon.     sertraline 50 MG tablet  Commonly known as:  ZOLOFT  Take 50 mg by mouth at bedtime.        Diet: routine diet  Activity: Advance as tolerated. Pelvic rest for 6 weeks.   Outpatient follow up:2 weeks and 6 weeks Follow up Appt:Future Appointments Date Time Provider Department Center  09/13/2015 10:00 AM [facility] [place] 1 [facility]   Follow up Visit:No Follow-up on file.  Postpartum contraception: Tubal Ligation  Newborn Data:   Robin, Valenzuela [[id number]]  Live born female  Birth Weight: 3 lb 8.1 oz (1590 g) APGAR: 8, 9   Robin, Valenzuela  [[id number]]  Live born female  Birth Weight: 3 lb 6.7 oz (1550 g) APGAR: 8, 8  Baby Feeding: Bottle and Breast Disposition:NICU x 2   09/08/2015 Valenzuela, Robin Javier, MD   Prior to d/c pt's BP more elevated diastolics in 105-110 range.  Change Procardia 30 XL to 60XL.  Given extra dose and monitored for several hours. D/W pt hypotensive sx's.  Will check BP at home and notify us if diastolic high (>100).  To f/u for BP and incision check.

## 2015-09-13 ENCOUNTER — Ambulatory Visit (HOSPITAL_COMMUNITY): Payer: Medicaid Other

## 2016-06-16 IMAGING — US US MFM OB FOLLOW-UP EACH ADDL GEST (MODIFY)
1 series · 12 of 28 positions shown · non-contrast
Comparison: none

[Series 1: us mfm ob follow-up each addl gest (modify) · 12 of 70 slices shown]
[im 3/70]
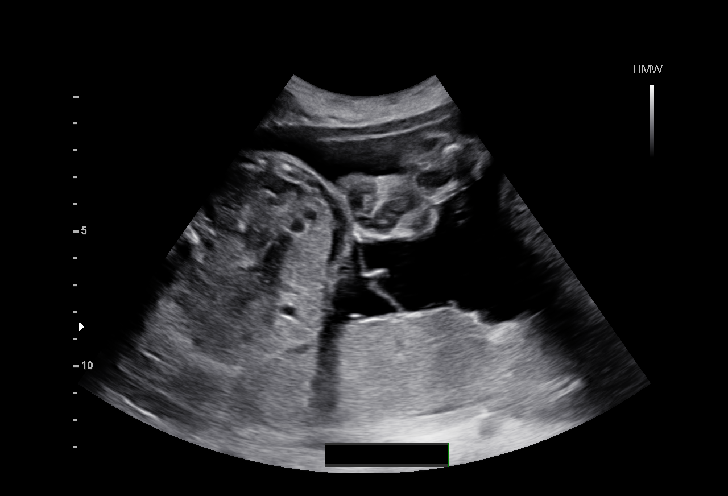
[im 8/70]
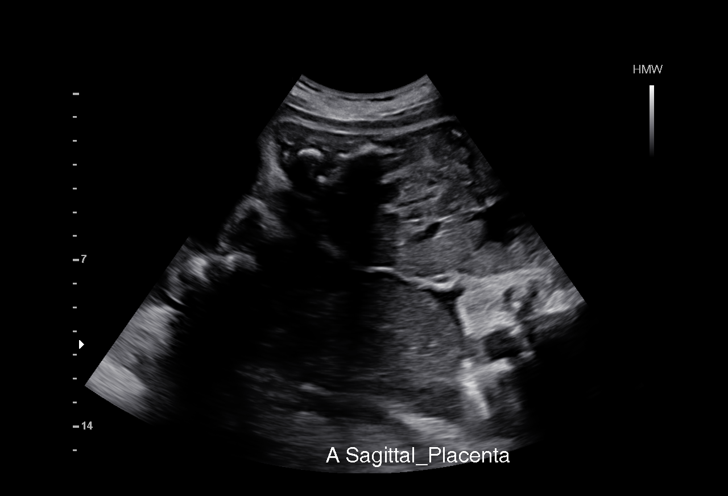
[im 13/70]
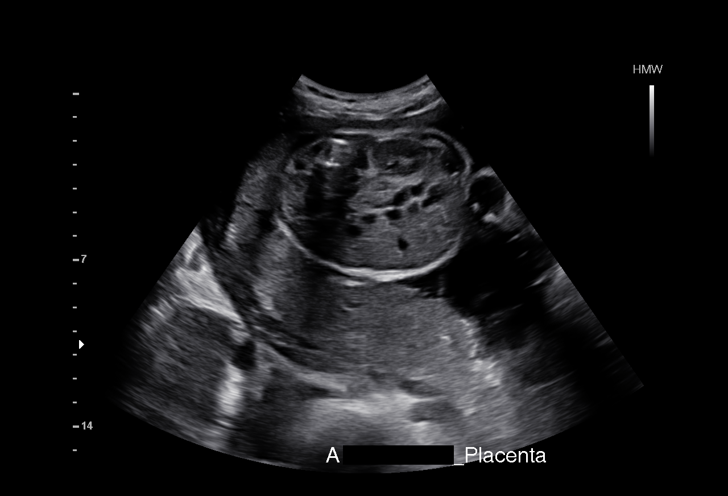
[im 21/70]
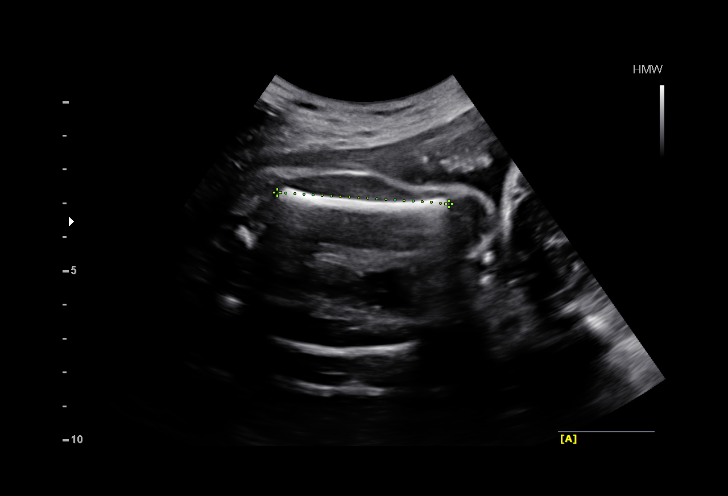
[im 26/70]
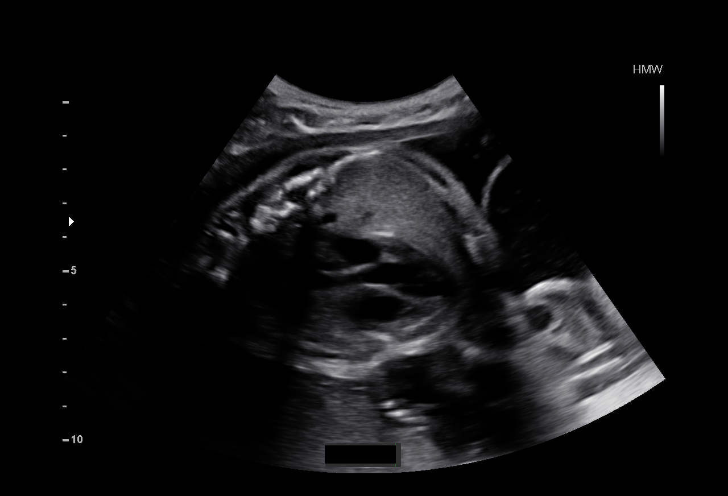
[im 31/70]
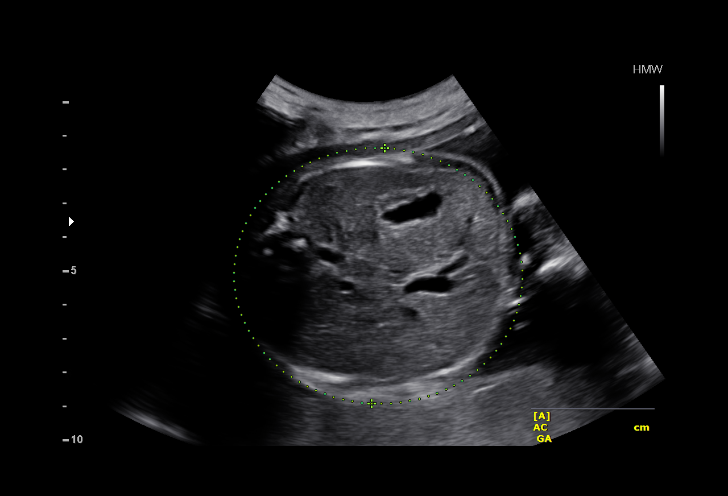
[im 39/70]
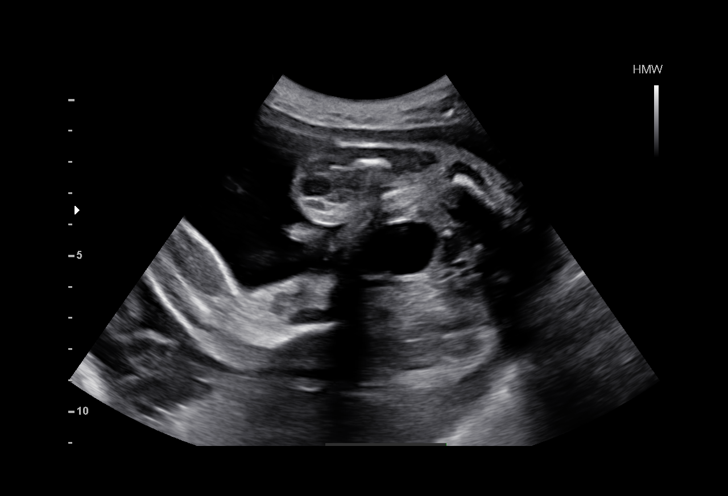
[im 44/70]
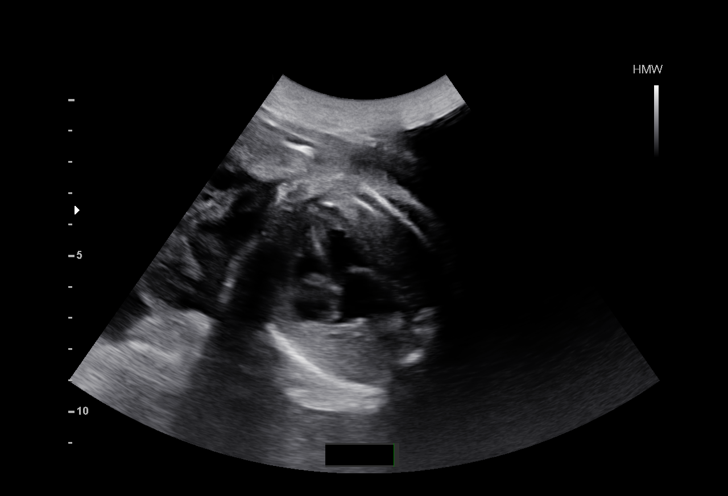
[im 49/70]
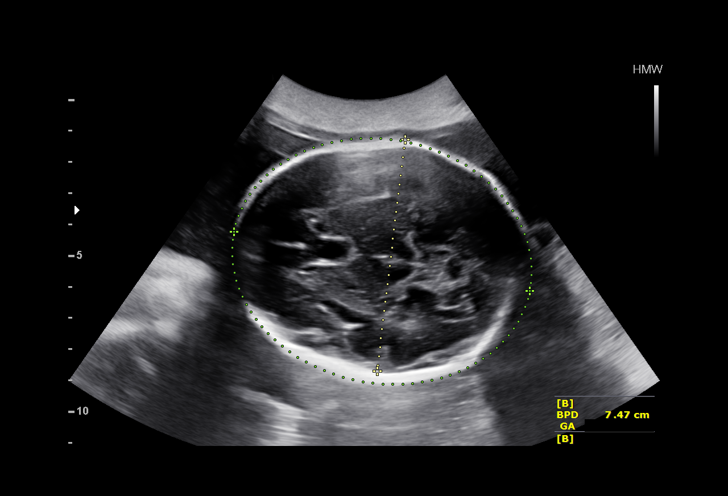
[im 57/70]
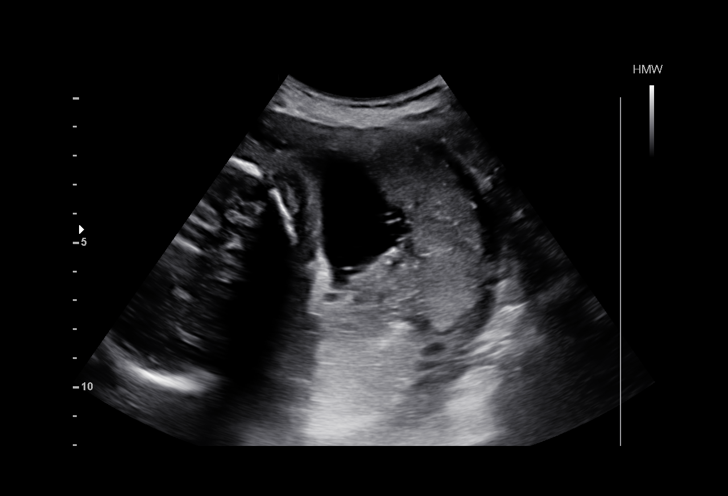
[im 62/70]
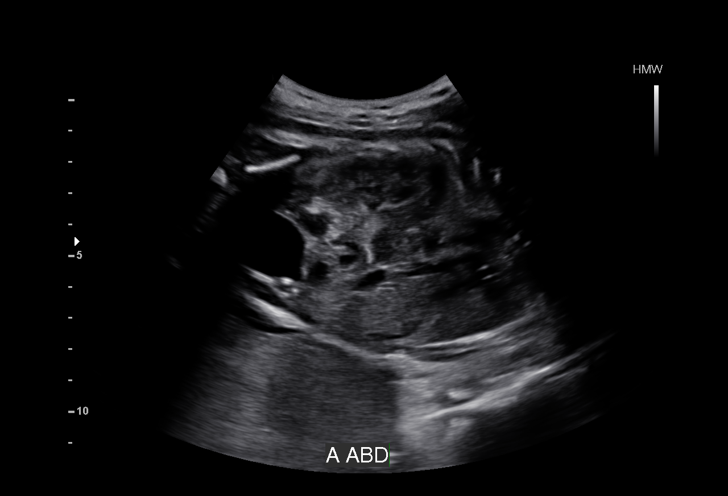
[im 67/70]
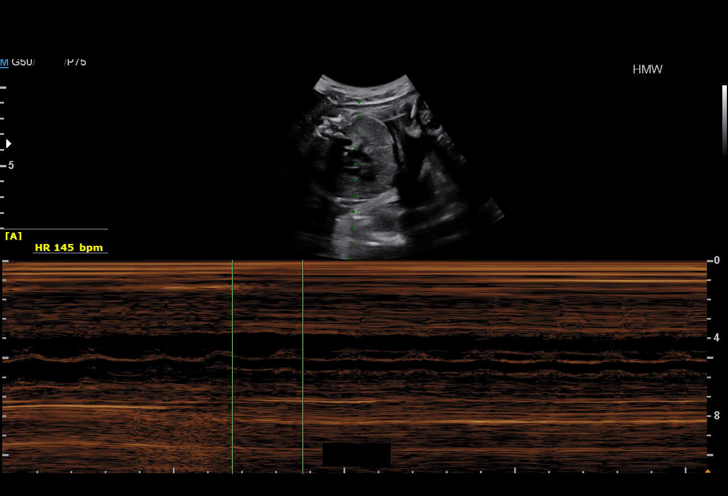

[12 of 28 positions shown; findings below may reference images not displayed]

am)

Date:

Inc.

1  ABRIANNA TA            [id number]       [id number]     [id number]
2  ABRIANNA TA            [id number]       [id number]     [id number]
Indications

29 weeks gestation of pregnancy
Cystic hygroma / increased nuchal
translucency (Twin A)
Echogenic bowel (Twin A)
Fetal choroid plexus cyst (Twin A & B)
Family history of cleft lip and palate
Low risk NIPS, negative CF screen,
negative viral serologies
Twin pregnancy, di/di, third trimester
OB History

Gravidity:     2         Term:  1        Prem:    0        SAB:   0
TOP:           0       Ectopic  0        Living:  1
:
Fetal Evaluation (Fetus A)

Num Of Fetuses:      2
Fetal Heart          145
Rate(bpm):
Cardiac Activity:    Observed
Fetal Lie:           Low Right Fetus
Presentation:        Cephalic
Placenta:            Posterior Right, above cervical os
P. Cord Insertion:   Previously Visualized
Membrane Desc:       Dividing Membrane seen - Dichorionic.

Amniotic Fluid
AFI FV:      Subjectively within normal limits
Larg Pckt:    5.82   cm
Biometry (Fetus A)

BPD:      80.7  mm     G. Age:   32w 3d                  CI:        80.29   %    70 - 86
FL/HC:      18.0   %    19.2 -
HC:      284.5  mm     G. Age:   31w 2d        55   %    HC/AC:      1.14        0.99 -
AC:      250.5  mm     G. Age:   29w 2d        27   %    FL/BPD      63.4   %    71 - 87
:
FL:       51.2  mm     G. Age:   27w 3d       < 3   %    FL/AC:      20.4   %    20 - 24
HUM:      45.8  mm     G. Age:   27w 0d       < 5   %

Est.        6620   gm   2 lb 15 oz      36   %    FW Discordancy       0 \ 2 %
FW:
Gestational Age (Fetus A)

Clinical EDD:  29w 6d                                         EDD:   10/26/15
U/S Today:     30w 1d                                         EDD:   10/24/15
Best:          29w 6d    Det. By:   Early Ultrasound          EDD:   10/26/15
(04/04/15)
Anatomy (Fetus A)

Cranium:          Appears normal         Aortic Arch:       Previously seen
Fetal Cavum:      Previously seen        Ductal Arch:       Previously seen
Ventricles:       Appears normal         Diaphragm:         Previously seen
Choroid Plexus:   Resolved CPC           Stomach:           Appears normal,
left sided
Cerebellum:       Previously seen        Abdomen:           Echogenic Bowel
prev seen
Posterior         Previously seen        Abdominal          Previously seen
Fossa:                                   Wall:
Nuchal Fold:      Previously seen        Cord Vessels:      Previously seen
Face:             Orbits and profile     Kidneys:           Appear normal
previously seen
Lips:             Previously seen        Bladder:           Appears normal
Palate:           Previously seen        Spine:             Previously seen
Heart:            Appears normal         Upper              Previously seen
(4CH, axis, and        Extremities:
situs)
RVOT:             Previously seen        Lower              Previously seen
Extremities:
LVOT:             Appears normal

Other:   Male gender previously seen. Heels and 5th digit previously seen.
Nasal bone previously visualized. Technically difficult due to fetal
position.
Fetal Evaluation (Fetus B)

Num Of Fetuses:      2
Fetal Heart          137
Rate(bpm):
Cardiac Activity:    Observed
Fetal Lie:           Upper Left Fetus
Presentation:        Breech
Placenta:            Posterior Fundal, above cervical os
P. Cord Insertion:   Previously Visualized
Membrane Desc:       Dividing Membrane seen - Dichorionic.

Amniotic Fluid
AFI FV:      Subjectively within normal limits
Larg Pckt:    4.74   cm
Biometry (Fetus B)

BPD:      74.7  mm     G. Age:   30w 0d                  CI:        72.97   %    70 - 86
FL/HC:      18.1   %    19.2 -
HC:        278  mm     G. Age:   30w 3d        31   %    HC/AC:      1.09        0.99 -
AC:      255.4  mm     G. Age:   29w 5d        41   %    FL/BPD      67.3   %    71 - 87
:
FL:       50.3  mm     G. Age:   27w 0d       < 3   %    FL/AC:      19.7   %    20 - 24
HUM:      44.8  mm     G. Age:   26w 4d       < 5   %

Est.        7157   gm   2 lb 14 oz      34   %    FW Discordancy          2  %
FW:
Gestational Age (Fetus B)

Clinical EDD:  29w 6d                                         EDD:   10/26/15
U/S Today:     29w 2d                                         EDD:   10/30/15
Best:          29w 6d    Det. By:   Early Ultrasound          EDD:   10/26/15
(04/04/15)
Anatomy (Fetus B)

Cranium:          Appears normal         Aortic Arch:       Previously seen
Fetal Cavum:      Previously seen        Ductal Arch:       Previously seen
Ventricles:       Previously seen        Diaphragm:         Previously seen
Choroid Plexus:   Resolved CPC           Stomach:           Appears normal,
left sided
Cerebellum:       Previously seen        Abdomen:           Appears normal
Posterior         Previously seen        Abdominal          Previously seen
Fossa:                                   Wall:
Nuchal Fold:      Previously seen        Cord Vessels:      Previously seen
Face:             Orbits and profile     Kidneys:           Appear normal
previously seen
Lips:             Previously seen        Bladder:           Appears normal
Palate:           Previously seen        Spine:             Previously seen
Heart:            Appears normal         Upper              Previously seen
(4CH, axis, and        Extremities:
situs)
RVOT:             Previously seen        Lower              Previously seen
Extremities:
LVOT:             Previously seen

Other:   Male gender previously seen. Heels and 5th digit previously seen.
Cervix Uterus Adnexa

Cervix
Not visualized (advanced GA >51wks)

Uterus
No abnormality visualized.

Left Ovary
Not visualized.

Right Ovary
Not visualized.

Cul De        No free fluid seen.
Sac:

Adnexa:       No abnormality visualized.
Impression

Dichorionic/diamniotic twin pregnancy at 29+6 weeks
Cephalic/breech presentation
Normal interval anatomy; anatomic survey complete x 2
Normal amniotic fluid volume x 2
Appropriate interval growths with EFWs at the 36th and
34th %tiles
Recommendations

Follow-up ultrasound for growth in 4 weeks (your office or
ours)

## 2016-09-29 DIAGNOSIS — E611 Iron deficiency: Secondary | ICD-10-CM | POA: Diagnosis not present

## 2016-09-29 DIAGNOSIS — N920 Excessive and frequent menstruation with regular cycle: Secondary | ICD-10-CM | POA: Diagnosis not present

## 2016-11-14 ENCOUNTER — Encounter (HOSPITAL_COMMUNITY): Payer: Self-pay | Admitting: Internal Medicine

## 2016-11-14 ENCOUNTER — Inpatient Hospital Stay (HOSPITAL_COMMUNITY)
Admission: AD | Admit: 2016-11-14 | Discharge: 2016-11-20 | DRG: 165 | Disposition: A | Discharge status: Home / Self Care | Payer: Medicaid Other | Source: Other Acute Inpatient Hospital | Attending: Cardiothoracic Surgery | Admitting: Cardiothoracic Surgery

## 2016-11-14 ENCOUNTER — Inpatient Hospital Stay (HOSPITAL_COMMUNITY): Payer: Medicaid Other

## 2016-11-14 DIAGNOSIS — Z720 Tobacco use: Secondary | ICD-10-CM | POA: Diagnosis present

## 2016-11-14 DIAGNOSIS — J9311 Primary spontaneous pneumothorax: Secondary | ICD-10-CM | POA: Diagnosis not present

## 2016-11-14 DIAGNOSIS — Z833 Family history of diabetes mellitus: Secondary | ICD-10-CM

## 2016-11-14 DIAGNOSIS — I10 Essential (primary) hypertension: Secondary | ICD-10-CM | POA: Diagnosis present

## 2016-11-14 DIAGNOSIS — K5900 Constipation, unspecified: Secondary | ICD-10-CM | POA: Diagnosis present

## 2016-11-14 DIAGNOSIS — Y838 Other surgical procedures as the cause of abnormal reaction of the patient, or of later complication, without mention of misadventure at the time of the procedure: Secondary | ICD-10-CM | POA: Diagnosis present

## 2016-11-14 DIAGNOSIS — J939 Pneumothorax, unspecified: Secondary | ICD-10-CM | POA: Diagnosis present

## 2016-11-14 DIAGNOSIS — Z79899 Other long term (current) drug therapy: Secondary | ICD-10-CM

## 2016-11-14 DIAGNOSIS — J930 Spontaneous tension pneumothorax: Principal | ICD-10-CM | POA: Diagnosis present

## 2016-11-14 DIAGNOSIS — F329 Major depressive disorder, single episode, unspecified: Secondary | ICD-10-CM | POA: Diagnosis present

## 2016-11-14 DIAGNOSIS — F419 Anxiety disorder, unspecified: Secondary | ICD-10-CM | POA: Diagnosis present

## 2016-11-14 DIAGNOSIS — F17210 Nicotine dependence, cigarettes, uncomplicated: Secondary | ICD-10-CM | POA: Diagnosis present

## 2016-11-14 DIAGNOSIS — Z09 Encounter for follow-up examination after completed treatment for conditions other than malignant neoplasm: Secondary | ICD-10-CM

## 2016-11-14 DIAGNOSIS — J9383 Other pneumothorax: Secondary | ICD-10-CM | POA: Diagnosis not present

## 2016-11-14 DIAGNOSIS — F32A Depression, unspecified: Secondary | ICD-10-CM | POA: Diagnosis present

## 2016-11-14 DIAGNOSIS — J449 Chronic obstructive pulmonary disease, unspecified: Secondary | ICD-10-CM | POA: Diagnosis present

## 2016-11-14 DIAGNOSIS — Z8249 Family history of ischemic heart disease and other diseases of the circulatory system: Secondary | ICD-10-CM | POA: Diagnosis not present

## 2016-11-14 DIAGNOSIS — R0602 Shortness of breath: Secondary | ICD-10-CM | POA: Diagnosis present

## 2016-11-14 DIAGNOSIS — Z8709 Personal history of other diseases of the respiratory system: Secondary | ICD-10-CM

## 2016-11-14 DIAGNOSIS — Z87730 Personal history of (corrected) cleft lip and palate: Secondary | ICD-10-CM

## 2016-11-14 HISTORY — DX: Major depressive disorder, single episode, unspecified: F32.9

## 2016-11-14 HISTORY — DX: Depression, unspecified: F32.A

## 2016-11-14 HISTORY — DX: Essential (primary) hypertension: I10

## 2016-11-14 HISTORY — DX: Tobacco use: Z72.0

## 2016-11-14 HISTORY — DX: Personal history of other diseases of the respiratory system: Z87.09

## 2016-11-14 LAB — CBC WITH DIFFERENTIAL/PLATELET
BASOS ABS: 0 10*3/uL (ref 0.0–0.1)
BASOS PCT: 0 %
EOS ABS: 0.1 10*3/uL (ref 0.0–0.7)
EOS PCT: 1 %
HCT: 38.3 % (ref 36.0–46.0)
Hemoglobin: 12.2 g/dL (ref 12.0–15.0)
LYMPHS PCT: 34 %
Lymphs Abs: 2 10*3/uL (ref 0.7–4.0)
MCH: 27.1 pg (ref 26.0–34.0)
MCHC: 31.9 g/dL (ref 30.0–36.0)
MCV: 84.9 fL (ref 78.0–100.0)
MONO ABS: 0.4 10*3/uL (ref 0.1–1.0)
Monocytes Relative: 6 %
Neutro Abs: 3.4 10*3/uL (ref 1.7–7.7)
Neutrophils Relative %: 59 %
PLATELETS: 236 10*3/uL (ref 150–400)
RBC: 4.51 MIL/uL (ref 3.87–5.11)
RDW: 14.2 % (ref 11.5–15.5)
WBC: 5.9 10*3/uL (ref 4.0–10.5)

## 2016-11-14 LAB — MRSA PCR SCREENING: MRSA BY PCR: NEGATIVE

## 2016-11-14 MED ORDER — ACETAMINOPHEN 650 MG RE SUPP: 650.0000 mg | Freq: Four times a day (QID) | RECTAL | Status: DC | PRN

## 2016-11-14 MED ORDER — ZOLPIDEM TARTRATE 5 MG PO TABS
5.0000 mg | ORAL_TABLET | Freq: Every evening | ORAL | Status: DC | PRN
  Administered 2016-11-14 – 2016-11-16 (×3): 5 mg via ORAL
  Filled 2016-11-14 (×3): qty 1

## 2016-11-14 MED ORDER — PRENATAL MULTIVITAMIN CH
1.0000 | ORAL_TABLET | Freq: Every day | ORAL | Status: DC
  Administered 2016-11-15 – 2016-11-16 (×2): 1 via ORAL
  Filled 2016-11-14 (×3): qty 1

## 2016-11-14 MED ORDER — DIPHENHYDRAMINE HCL 25 MG PO CAPS
25.0000 mg | ORAL_CAPSULE | Freq: Three times a day (TID) | ORAL | Status: DC | PRN
  Administered 2016-11-14 – 2016-11-15 (×2): 25 mg via ORAL
  Filled 2016-11-14 (×2): qty 1

## 2016-11-14 MED ORDER — POLYETHYLENE GLYCOL 3350 17 G PO PACK: 17.0000 g | PACK | Freq: Every day | ORAL | Status: DC | PRN

## 2016-11-14 MED ORDER — SODIUM CHLORIDE 0.9% FLUSH
3.0000 mL | Freq: Two times a day (BID) | INTRAVENOUS | Status: DC
  Administered 2016-11-14 – 2016-11-17 (×5): 3 mL via INTRAVENOUS

## 2016-11-14 MED ORDER — ACETAMINOPHEN 325 MG PO TABS: 650.0000 mg | ORAL_TABLET | Freq: Four times a day (QID) | ORAL | Status: DC | PRN

## 2016-11-14 MED ORDER — SODIUM CHLORIDE 0.9 % IV SOLN
INTRAVENOUS | Status: DC
  Administered 2016-11-14 – 2016-11-17 (×4): via INTRAVENOUS

## 2016-11-14 MED ORDER — SENNOSIDES-DOCUSATE SODIUM 8.6-50 MG PO TABS
1.0000 | ORAL_TABLET | Freq: Two times a day (BID) | ORAL | Status: DC
  Administered 2016-11-14 – 2016-11-16 (×4): 1 via ORAL
  Filled 2016-11-14 (×5): qty 1

## 2016-11-14 MED ORDER — HYDRALAZINE HCL 20 MG/ML IJ SOLN: 5.0000 mg | INTRAMUSCULAR | Status: DC | PRN

## 2016-11-14 MED ORDER — ONDANSETRON HCL 4 MG/2ML IJ SOLN: 4.0000 mg | Freq: Three times a day (TID) | INTRAMUSCULAR | Status: DC | PRN

## 2016-11-14 MED ORDER — NIFEDIPINE ER 60 MG PO TB24
60.0000 mg | ORAL_TABLET | Freq: Every day | ORAL | Status: DC
  Administered 2016-11-15 – 2016-11-16 (×2): 60 mg via ORAL
  Filled 2016-11-14 (×3): qty 1

## 2016-11-14 MED ORDER — OXYCODONE-ACETAMINOPHEN 5-325 MG PO TABS
1.0000 | ORAL_TABLET | ORAL | Status: DC | PRN
  Administered 2016-11-14 – 2016-11-17 (×9): 1 via ORAL
  Filled 2016-11-14 (×9): qty 1

## 2016-11-14 MED ORDER — SERTRALINE HCL 50 MG PO TABS
50.0000 mg | ORAL_TABLET | Freq: Every day | ORAL | Status: DC
  Administered 2016-11-14 – 2016-11-15 (×2): 50 mg via ORAL
  Filled 2016-11-14 (×2): qty 1

## 2016-11-14 MED ORDER — DM-GUAIFENESIN ER 30-600 MG PO TB12
1.0000 | ORAL_TABLET | Freq: Two times a day (BID) | ORAL | Status: DC | PRN
  Filled 2016-11-14: qty 1

## 2016-11-14 MED ORDER — NICOTINE 21 MG/24HR TD PT24
21.0000 mg | MEDICATED_PATCH | Freq: Every day | TRANSDERMAL | Status: DC
  Administered 2016-11-15 – 2016-11-17 (×3): 21 mg via TRANSDERMAL
  Filled 2016-11-14 (×3): qty 1

## 2016-11-14 NOTE — Progress Notes (Signed)
Tyrone Sage, MD notified about patient's arrival from [place]. MD says he will see her in the morning.

## 2016-11-14 NOTE — Progress Notes (Signed)
Patient arrive from [facility] via [facility] without complication. Chest tube intact with suction on . BP 165/96 (116), HR 76, NSR, Temp 98.2. Patient currently has complaints of pain, itching, and constipation. TRH paged. Will continue to monitor.

## 2016-11-14 NOTE — H&P (Signed)
History and Physical    Robin Valenzuela ZOX:[id number] DOB: 02-03-1981 DOA: 11/14/2016  Referring MD/NP/PA:   PCP: No PCP Per Patient   Patient coming from:  The patient is coming from home.  At baseline, pt is independent for most of ADL.  Chief Complaint: Chest pain and shortness of breath  HPI: Robin Valenzuela is a 36 y.o. female with medical history significant of spontaneous pneumothorax in a month ago, hypertension, depression, anxiety, tobacco abuse, who presents with chest pain or shortness of breath.  Patient states that she had pneumothorax 8 month ago. 2 days ago, she started having sudden onset of chest pain and shortness rest. She was admitted to [facility]. She was found to have recurrent right-sided spontaneous tension pneumothorax. Chest tube was placed. Her symptoms improved significantly. Pt was found to have fairly large air leak from chest tube, therefore pt is transferred to [facility] after consulted to cardiothoracic surgeon, Dr. Tyrone Sage. When I saw pt on the floor, patient states that she does not have SOB, cough, fever or chills. She still has right-sided chest pain, which is constant, sharp, 7 out of 10 in severity, radiating to the right shoulder blade. It is aggravated by deep breath and coughing. She denies nausea, vomiting, diarrhea, abdominal pain. She is constipated. No symptoms of UTI or unilateral weakness.  In [facility], pt was found to have WBC 9.3, electrolytes renal function okay.  Pt is admitted to [facility] as inpt. Cardiothoracic surgeon, Dr. Tyrone Sage is consulted  Review of Systems:   General: no fevers, chills, no changes in body weight, has fatigue HEENT: no blurry vision, hearing changes or sore throat Respiratory: no dyspnea, coughing, wheezing CV: has chest pain, no palpitations GI: no nausea, vomiting, abdominal pain, diarrhea, has constipation GU: no dysuria, burning on urination, increased urinary frequency, hematuria  Ext: no leg  edema Neuro: no unilateral weakness, numbness, or tingling, no vision change or hearing loss Skin: no rash, no skin tear. MSK: No muscle spasm, no deformity, no limitation of range of movement in spin Heme: No easy bruising.  Travel history: No recent long distant travel.  Allergy: No Known Allergies  Past Medical History:  Diagnosis Date  . Abnormal Pap smear   . Anxiety   . Depression   . 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 Essential hypertension   . Pneumothorax   . Tobacco abuse     Past Surgical History:  Procedure Laterality Date  . CESAREAN SECTION N/A 09/06/2015   Procedure: CESAREAN SECTION;  Surgeon: Huel Cote, MD;  Location: [facility]S;  Service: Obstetrics;  Laterality: N/A;  . CHOLECYSTECTOMY    . CLEFT LIP REPAIR Left   . COLPOSCOPY    . CRYOTHERAPY    . RHINOPLASTY FOR CLEFT LIP / PALATE    . WISDOM TOOTH EXTRACTION      Social History:  reports that she has been smoking Cigarettes.  She has a 7.50 pack-year smoking history. She has never used smokeless tobacco. She reports that she does not drink alcohol or use drugs.  Family History:  Family History  Problem Relation Age of Onset  . Diabetes Mellitus II Mother   . Hypertension Mother   . Lung cancer Father   . Hypertension Father   . Hepatitis C Father      Prior to Admission medications   Medication Sig Start Date End Date Taking? Authorizing Provider  ibuprofen (ADVIL,MOTRIN) 800 MG tablet Take 1 tablet (800 mg total) by mouth every  8 (eight) hours as needed. 09/08/15   Sherian Rein, MD  NIFEdipine (PROCARDIA-XL/ADALAT CC) 60 MG 24 hr tablet Take 1 tablet (60 mg total) by mouth daily. 09/09/15   Jody Bovard-Stuckert, MD  oxyCODONE (OXY IR/ROXICODONE) 5 MG immediate release tablet Take 1 tablet (5 mg total) by mouth every 6 (six) hours as needed for severe pain. 09/08/15   Sherian Rein, MD  Prenatal Vit-Fe Fumarate-FA (PRENATAL MULTIVITAMIN) TABS tablet Take 1 tablet by  mouth daily at 12 noon. 09/08/15   Jody Bovard-Stuckert, MD  sertraline (ZOLOFT) 50 MG tablet Take 50 mg by mouth at bedtime.     Historical Provider, MD    Physical Exam: Vitals:   11/14/16 2114  BP: (!) 165/93  Pulse: 77  Resp: 13  Temp: 98.2 F (36.8 C)  TempSrc: Oral  SpO2: 99%  Weight: 85.1 kg (187 lb 9.8 oz)  Height:  (1.753 m)   General: Not in acute distress HEENT:       Eyes: PERRL, EOMI, no scleral icterus.       ENT: No discharge from the ears and nose, no pharynx injection, no tonsillar enlargement.        Neck: No JVD, no bruit, no mass felt. Heme: No neck lymph node enlargement. Cardiac: S1/S2, RRR, No murmurs, No gallops or rubs. Respiratory: has decreased air movement on the right side, R chest tube in place. No rales, wheezing, rhonchi or rubs. GI: Soft, nondistended, nontender, no rebound pain, no organomegaly, BS present. GU: No hematuria Ext: No pitting leg edema bilaterally. 2+DP/PT pulse bilaterally. Musculoskeletal: No joint deformities, No joint redness or warmth, no limitation of ROM in spin. Skin: No rashes.  Neuro: Alert, oriented X3, cranial nerves II-XII grossly intact, moves all extremities normally. Psych: Patient is not psychotic, no suicidal or hemocidal ideation.  Labs on Admission: I have personally reviewed following labs and imaging studies  CBC: No results for input(s): WBC, NEUTROABS, HGB, HCT, MCV, PLT in the last 168 hours. Basic Metabolic Panel: No results for input(s): NA, K, CL, CO2, GLUCOSE, BUN, CREATININE, CALCIUM, MG, PHOS in the last 168 hours. GFR: CrCl cannot be calculated (Patient's most recent lab result is older than the maximum 21 days allowed.). Liver Function Tests: No results for input(s): AST, ALT, ALKPHOS, BILITOT, PROT, ALBUMIN in the last 168 hours. No results for input(s): LIPASE, AMYLASE in the last 168 hours. No results for input(s): AMMONIA in the last 168 hours. Coagulation Profile: No results for  input(s): INR, PROTIME in the last 168 hours. Cardiac Enzymes: No results for input(s): CKTOTAL, CKMB, CKMBINDEX, TROPONINI in the last 168 hours. BNP (last 3 results) No results for input(s): PROBNP in the last 8760 hours. HbA1C: No results for input(s): HGBA1C in the last 72 hours. CBG: No results for input(s): GLUCAP in the last 168 hours. Lipid Profile: No results for input(s): CHOL, HDL, LDLCALC, TRIG, CHOLHDL, LDLDIRECT in the last 72 hours. Thyroid Function Tests: No results for input(s): TSH, T4TOTAL, FREET4, T3FREE, THYROIDAB in the last 72 hours. Anemia Panel: No results for input(s): VITAMINB12, FOLATE, FERRITIN, TIBC, IRON, RETICCTPCT in the last 72 hours. Urine analysis:    Component Value Date/Time   COLORURINE YELLOW 07/22/2015 1217   APPEARANCEUR CLEAR 07/22/2015 1217   LABSPEC 1.020 07/22/2015 1217   PHURINE 6.5 07/22/2015 1217   GLUCOSEU NEGATIVE 07/22/2015 1217   HGBUR TRACE (A) 07/22/2015 1217   BILIRUBINUR NEGATIVE 07/22/2015 1217   KETONESUR NEGATIVE 07/22/2015 1217   PROTEINUR NEGATIVE 07/22/2015 1217  NITRITE NEGATIVE 07/22/2015 1217   LEUKOCYTESUR SMALL (A) 07/22/2015 1217   Sepsis Labs: (procalcitonin:4,lacticidven:4) )No results found for this or any previous visit (from the past 240 hour(s)).   Radiological Exams on Admission: No results found.   EKG: Not done in ED, will get one.   Assessment/Plan Principal Problem:   Pneumothorax Active Problems:   Essential hypertension   Depression   Tobacco abuse   Constipation   Recurrent spontaneous Pneumothorax: Etiology is not clear. Patient is smoker, patient may have contributed partially. Chest tube is placed, shortness of breath has resolved. No signs of infection. Now has air leakage from chest tube. Cardiothoracic surgeon, Dr. Tyrone Sage is consulted.  -will admit to [facility] as inpt -f/u Cardiothoracic surgeon, Dr. Dennie Maizes recommendations -When necessary Percocet and tylenol for  pain and prn Mucinex for cough -check basic labs: CBC and BMP, preg test -repeat CXR portable  HTN: -continue nifedipine -IVF hydralazine when necessary  Depression: Stable, no suicidal or homicidal ideations. -Continue home medications: zoloft  Tobacco abuse: -Did counseling about importance of quitting smoking -Nicotine patch  Constipation: -MiraLAX and senokot   DVT ppx: SCd Code Status: Full code Family Communication: None at bed side. Disposition Plan:  Anticipate discharge back to previous home environment Consults called: Cardiothoracic surgeon, Dr. Tyrone Sage is consulted  Admission status: SDU/inpation       Date of Service 11/14/2016    Lorretta Harp [facility] Pager [phone]  If 7PM-7AM, please contact night-coverage [email or web address] Password [facility] 11/14/2016, 10:25 PM

## 2016-11-15 ENCOUNTER — Inpatient Hospital Stay (HOSPITAL_COMMUNITY): Payer: Medicaid Other

## 2016-11-15 DIAGNOSIS — J9311 Primary spontaneous pneumothorax: Secondary | ICD-10-CM

## 2016-11-15 DIAGNOSIS — F329 Major depressive disorder, single episode, unspecified: Secondary | ICD-10-CM

## 2016-11-15 DIAGNOSIS — J9383 Other pneumothorax: Secondary | ICD-10-CM

## 2016-11-15 LAB — BASIC METABOLIC PANEL
ANION GAP: 11 (ref 5–15)
ANION GAP: 10 (ref 5–15)
BUN: 5 mg/dL — AB (ref 6–20)
BUN: 5 mg/dL — AB (ref 6–20)
CALCIUM: 8.8 mg/dL — AB (ref 8.9–10.3)
CO2: 20 mmol/L — AB (ref 22–32)
CO2: 23 mmol/L (ref 22–32)
CREATININE: 0.78 mg/dL (ref 0.44–1.00)
CREATININE: 0.81 mg/dL (ref 0.44–1.00)
Calcium: 9.1 mg/dL (ref 8.9–10.3)
Chloride: 106 mmol/L (ref 101–111)
Chloride: 105 mmol/L (ref 101–111)
GFR calc Af Amer: >60 mL/min (ref >60)
GLUCOSE: 100 mg/dL — AB (ref 65–99)
Glucose, Bld: 87 mg/dL (ref 65–99)
Potassium: 3.5 mmol/L (ref 3.5–5.1)
Potassium: 3.6 mmol/L (ref 3.5–5.1)
SODIUM: 138 mmol/L (ref 135–145)
Sodium: 137 mmol/L (ref 135–145)

## 2016-11-15 LAB — CBC
HEMATOCRIT: 44.3 % (ref 36.0–46.0)
Hemoglobin: 14.3 g/dL (ref 12.0–15.0)
MCH: 27.7 pg (ref 26.0–34.0)
MCHC: 32.3 g/dL (ref 30.0–36.0)
MCV: 85.7 fL (ref 78.0–100.0)
Platelets: 299 10*3/uL (ref 150–400)
RBC: 5.17 MIL/uL — ABNORMAL HIGH (ref 3.87–5.11)
RDW: 14 % (ref 11.5–15.5)
WBC: 6.5 10*3/uL (ref 4.0–10.5)

## 2016-11-15 LAB — HCG, QUANTITATIVE, PREGNANCY: HCG, BETA CHAIN, QUANT, S: 1 m[IU]/mL (ref <5)

## 2016-11-15 LAB — HIV ANTIBODY (ROUTINE TESTING W REFLEX): HIV SCREEN 4TH GENERATION: NONREACTIVE

## 2016-11-15 NOTE — Consult Note (Signed)
[address]       [place] [address]             [phone]        SHERYL SAINTIL [facility] Medical Record #[id number] Date of Birth: 1981-05-03  Referring: Dr. Clyde Lundborg Primary Care: No PCP Per Patient  Chief Complaint:   Acute right chest pain and shortness of breath Reason for consultation: Recurrent, right spontaneous pneumothorax   History of Present Illness:     This is a 36 y.o. female with medical history significant for spontaneous right pneumothorax (eight a months ago), hypertension, depression, anxiety, tobacco abuse, who presented to [facility] with complaints of chest pain and shortness of breath. She described her chest pain as fairly constant, sharp, 7 out of 10 in severity, radiating to the right shoulder blade. It is aggravated by deep breath and coughing. She denies nausea, vomiting, diarrhea, abdominal pain.  Patient states that she had pneumothorax 8 month ago. 2 days ago, she started having sudden onset of chest pain and shortness rest. She was admitted to [facility]. She was found to have recurrent right-sided spontaneous tension pneumothorax. Chest tube was placed. Her symptoms improved significantly. Pt was found to have fairly large air leak from chest tube; therefore, patient was transferred to [facility] for further evaluation by Dr. Tyrone Sage.  She currently is in no acute distress, her oxygenation is in the high 90's on 1 liter of oxygen via [place].  There has been no chest x ray done today;however, last evening's showed a tiny right apical pneumothorax, elevated left hemidiaphragm, and left base atelectasis.   Of note, chest tube had air leak earlier this am, but nurse found chest tube was disconnected from Pleura Vac still wrapped in tape . Now there is no air leak on -30cm suction    Current Activity/ Functional Status: Patient is independent with mobility/ambulation, transfers, ADL's, IADL's.   Zubrod Score: At the  time of surgery this patient's most appropriate activity status/level should be described as:     0    Normal activity, no symptoms     1    Restricted in physical strenuous activity but ambulatory, able to do out light work     2    Ambulatory and capable of self care, unable to do work activities, up and about                 more than 50%  Of the time                                3    Only limited self care, in bed greater than 50% of waking hours     4    Completely disabled, no self care, confined to bed or chair     5    Moribund  Past Medical History:  Diagnosis Date  . Abnormal Pap smear   . Anxiety   . Depression   . 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 Essential hypertension   . Pneumothorax   . Tobacco abuse     Past Surgical History:  Procedure Laterality Date  . CESAREAN SECTION N/A 09/06/2015   Procedure: CESAREAN SECTION;  Surgeon: Huel Cote, MD;  Location: [facility];  Service: Obstetrics;  Laterality: N/A;  . CHOLECYSTECTOMY    . CLEFT LIP REPAIR Left   . COLPOSCOPY    .  CRYOTHERAPY    . RHINOPLASTY FOR CLEFT LIP / PALATE    . WISDOM TOOTH EXTRACTION     Social History   Social History  . Marital status: Single    Spouse name: N/A  . Number of children: 23-twin 61 year old boys and a 36 year old girl  . Years of education: N/A    Social History Main Topics  . Smoking status: Current Every Day Smoker    Packs/day: 0.50    Years: 15.00    Types: Cigarettes  . Smokeless tobacco: Never Used  . Alcohol use No  . Drug use: No  . Sexual activity: Yes   Allergies: No Known Allergies  Current Facility-Administered Medications  Medication Dose Route Frequency Provider Last Rate Last Dose  . 0.9 %  sodium chloride infusion   Intravenous Continuous Lorretta Harp, MD 75 mL/hr at 11/14/16 2251    . acetaminophen (TYLENOL) tablet 650 mg  650 mg Oral Q6H PRN Lorretta Harp, MD       Or  . acetaminophen (TYLENOL) suppository 650 mg  650 mg  Rectal Q6H PRN Lorretta Harp, MD      . dextromethorphan-guaiFENesin [facility] DM) 30-600 MG per 12 hr tablet 1 tablet  1 tablet Oral BID PRN Lorretta Harp, MD      . diphenhydrAMINE (BENADRYL) capsule 25 mg  25 mg Oral Q8H PRN Lorretta Harp, MD   25 mg at 11/14/16 2258  . hydrALAZINE (APRESOLINE) injection 5 mg  5 mg Intravenous Q2H PRN Lorretta Harp, MD      . nicotine (NICODERM CQ - dosed in mg/24 hours) patch 21 mg  21 mg Transdermal Daily Lorretta Harp, MD      . NIFEdipine (PROCARDIA-XL/ADALAT CC) 24 hr tablet 60 mg  60 mg Oral Daily Lorretta Harp, MD      . ondansetron [facility]) injection 4 mg  4 mg Intravenous Q8H PRN Lorretta Harp, MD      . oxyCODONE-acetaminophen (PERCOCET/ROXICET) 5-325 MG per tablet 1 tablet  1 tablet Oral Q4H PRN Lorretta Harp, MD   1 tablet at 11/15/16 0645  . polyethylene glycol (MIRALAX / GLYCOLAX) packet 17 g  17 g Oral Daily PRN Lorretta Harp, MD      . prenatal multivitamin tablet 1 tablet  1 tablet Oral Q1200 Lorretta Harp, MD      . senna-docusate (Senokot-S) tablet 1 tablet  1 tablet Oral BID Lorretta Harp, MD   1 tablet at 11/14/16 2258  . sertraline (ZOLOFT) tablet 50 mg  50 mg Oral QHS Lorretta Harp, MD   50 mg at 11/14/16 2258  . sodium chloride flush (NS) 0.9 % injection 3 mL  3 mL Intravenous Q12H Lorretta Harp, MD   3 mL at 11/14/16 2253  . zolpidem (AMBIEN) tablet 5 mg  5 mg Oral QHS PRN Lorretta Harp, MD   5 mg at 11/14/16 2259    Prescriptions Prior to Admission  Medication Sig Dispense Refill Last Dose  . ibuprofen (ADVIL,MOTRIN) 800 MG tablet Take 1 tablet (800 mg total) by mouth every 8 (eight) hours as needed. 45 tablet 1   . NIFEdipine (PROCARDIA-XL/ADALAT CC) 60 MG 24 hr tablet Take 1 tablet (60 mg total) by mouth daily. 30 tablet 3   . oxyCODONE (OXY IR/ROXICODONE) 5 MG immediate release tablet Take 1 tablet (5 mg total) by mouth every 6 (six) hours as needed for severe pain. 40 tablet 0   . Prenatal Vit-Fe Fumarate-FA (PRENATAL MULTIVITAMIN) TABS tablet Take 1 tablet  by mouth daily at 12 noon.  30 tablet 12   . sertraline (ZOLOFT) 50 MG tablet Take 50 mg by mouth at bedtime.    09/02/2015 at Unknown time    Family History  Problem Relation Age of Onset  . Diabetes Mellitus II Mother   . Hypertension Mother   . Lung cancer Father   . Hypertension Father   . Hepatitis C Father   Review of Systems:      Cardiac Review of Systems: Y or N  Chest Pain [  Y  ] Resting SOB [ Y  ]Exertional SOB  [ Y ]     Pedal Edema [ N  ]    Palpitations [ N ] Syncope  [  N]   Presyncope Klaus.Mock   ]  General Review of Systems: [Y] = yes [ N ]=no Constitional: recent weight change [ Y-GAIN ];  nausea [ N ]; night sweats Klaus.Mock  ]; fever Klaus.Mock ]; or chills Klaus.Mock  ]                                                          Eye : blurred vision [ N ]; Resp: cough [ Y ];  wheezing[ N ];  hemoptysis[ N ];  GI:   vomiting[ N ];  dysphagia[ N ];  GU: hematuria[ N ];   dysuria [N  ];               Skin: rash, swelling[  N];, Musculosketetal: myalgias[N  ]  Heme/Lymph: bleeding[N  ];    Neuro:   stroke[ N ]; difficulty walking[N  ];  Psych:depression[Y  ]; anxiety[ Y ];  Endocrine: diabetes[ N ];  thyroid dysfunction[ N ];    Physical Exam: BP (!) 151/94   Pulse 68   Temp 98.5 F (36.9 C) (Oral)   Resp 19   Ht  (1.753 m)   Wt 85.1 kg (187 lb 9.8 oz)   SpO2 95%   BMI 27.71 kg/m    General appearance: alert, cooperative and no distress Head: Normocephalic, without obvious abnormality, atraumatic Neck: supple, symmetrical, trachea midline  Resp: Somewhat diminished breath sounds on right;left lung is mostly clear Cardio: RRR, no murmur GI: Soft, non tender, bowel sounds present Extremities: No LE edema and feet warm Neurologic: Grossly normal  Diagnostic Studies & Laboratory data:     Recent Radiology Findings:   Dg Chest Port 1 View  Result Date: 11/14/2016 CLINICAL DATA:  Right-sided chest tube in place for pneumothorax. EXAM: PORTABLE CHEST 1 VIEW COMPARISON:  None. FINDINGS: There is a tiny  apical pneumothorax measuring 7 mm in thickness. Right-sided chest tube extends up to the posterior right fourth rib level. No mediastinal shift is seen. Heart is normal in size. No aortic aneurysm. Elevated left hemidiaphragm is noted with left basilar atelectasis. No acute osseous appearing abnormality is identified. IMPRESSION: Tiny apical pneumothorax of less than 5% measuring 7 mm in thickness. Elevated appearing left hemidiaphragm versus eventration of the diaphragm with left basilar atelectasis. Electronically Signed   By: Tollie Eth M.D.   On: 11/14/2016 22:55     I have independently reviewed the above radiologic studies.  Recent Lab Findings: Lab Results  Component Value Date   WBC 6.5 11/15/2016   HGB 14.3 11/15/2016  HCT 44.3 11/15/2016   PLT 299 11/15/2016   GLUCOSE 87 11/15/2016   ALT 13 (L) 09/07/2015   AST 26 09/07/2015   NA 138 11/15/2016   K 3.6 11/15/2016   CL 105 11/15/2016   CREATININE 0.81 11/15/2016   BUN 5 (L) 11/15/2016   CO2 23 11/15/2016   Assessment / Plan:      1.Recurrent, right spontaneous pneumothorax-chest tube is to suction. There is no air leak. Needs a CT scan as may have blebs. Will order CXR for am. Etiology may be related to her tobacco abuse. On Nictotine patch. 2.Hypertenstion-on Nifedipine 60 mg daily 3.Depression-on Zoloft 50 mg at hs 4. Currently NPO but will allow diet as no OR planned for today.  Doree Fudge PA-C 11/15/2016 8:59 AM  I  spent 20  minutes counseling the patient face to face and 50% or more the  time was spent in counseling and coordination of care. The total time spent in the appointment was  40 minutes.  Recurrent right PTX in 36 yo smoker. Chest tube place  In [facility]  ER , large air was noted but likely to faulty connection of chest bottle system. Regards patient has had two right PTX in last 8 months. Will get chest CT to check for blebs-  And consider for VATS  Ct to water seal  I have seen and examined  [name] and agree with the above assessment  and plan.  Delight Ovens MD Beeper [phone] Office [phone] 11/15/2016 11:28 AM

## 2016-11-15 NOTE — Progress Notes (Signed)
PROGRESS NOTE    KIMMORA RISENHOOVER  WUJ:[id number] DOB: [birth year] DOA: 11/14/2016 PCP: No PCP Per Patient   Brief Narrative:  36 year old female with past medical history of spontaneous pneumothorax, hypertension, depression, anxiety, tobacco use came to the ER with the complaints of shortness of breath at [place]. She was transferred here for further care for chest tube management and cardiac thoracic surgery eval.   Assessment & Plan:   Principal Problem:   Pneumothorax Active Problems:   Essential hypertension   Depression   Tobacco abuse   Constipation  Recurrent spontaneous pneumothorax-concerns for tension pneumothorax -Currently she has chest tube in place and no leak has been noted next line -cardiac thoracic surgery is being consulted -Pain control and Mucinex for cough -she needs CT of the chest possible blebs eval, results are pending at this time. Likely ruptured one of them with persistent coughing.  -I suspect she is underlying COPD given her heavy tobacco use. She needs to follow-up outpatient with pulmonary. -nebs prn   Hypertension -Continue nifedipine and she can get hydralazine as needed  Depression -Continue Zoloft  Tobacco use -nicotine patch     DVT prophylaxis: SCDs Code Status: Full Family Communication:  Patient comprehends well Disposition Plan: Maintain inpatient stay at this time.  Consultants:   Cardiothoracic surgery  Procedures:   Chest tube placed on 11/14/2016  Antimicrobials:   None   Subjective: Patient states her breathing is much better since the chest tube was placed. She continues to have right-sided chest pain at the site of insertion of her chest. No air leak noted.   Pulse: 84 68    Resp: 19 19    Temp: 98.2 F (36.8 C) 98.4 F (36.9 C) 98.5 F (36.9 C)   TempSrc: Oral Oral Oral   SpO2: 97% 98% 95% 99%  Weight:      Height:      Patient states  Intake/Output Summary (Last 24 hours) at 11/15/16 1256 Last  data filed at 11/15/16 1100  Gross per 24 hour  Intake          1151.25 ml  Output              508 ml  Net           643.25 ml   Filed Weights   11/14/16 2114  Weight: 85.1 kg (187 lb 9.8 oz)    Examination:  General exam: Appears calm and comfortable  Respiratory system: Clear to auscultation. Respiratory effort normal. Cardiovascular system: S1 & S2 heard, RRR. No JVD, murmurs, rubs, gallops or clicks. No pedal edema. Gastrointestinal system: Abdomen is nondistended, soft and nontender. No organomegaly or masses felt. Normal bowel sounds heard. Central nervous system: Alert and oriented. No focal neurological deficits. Extremities: Symmetric 5 x 5 power. Skin: No rashes, lesions or ulcers Psychiatry: Judgement and insight appear normal. Mood & affect appropriate.   Right-sided chest wall chest tube noted. No signs of active bleeding at the site or infection. tender to palpation around the site as expected  Data Reviewed:   CBC:  Recent Labs Lab 11/14/16 2320 11/15/16 0314  WBC 5.9 6.5  NEUTROABS 3.4  --   HGB 12.2 14.3  HCT 38.3 44.3  MCV 84.9 85.7  PLT 236 299   Basic Metabolic Panel:  Recent Labs Lab 11/14/16 2320 11/15/16 0314  NA 137 138  K 3.5 3.6  CL 106 105  CO2 20* 23  GLUCOSE 100* 87  BUN 5* 5*  CREATININE  0.78 0.81  CALCIUM 8.8* 9.1   GFR: Estimated Creatinine Clearance: 111.9 mL/min (by C-G formula based on SCr of 0.81 mg/dL). Liver Function Tests: No results for input(s): AST, ALT, ALKPHOS, BILITOT, PROT, ALBUMIN in the last 168 hours. No results for input(s): LIPASE, AMYLASE in the last 168 hours. No results for input(s): AMMONIA in the last 168 hours. Coagulation Profile: No results for input(s): INR, PROTIME in the last 168 hours. Cardiac Enzymes: No results for input(s): CKTOTAL, CKMB, CKMBINDEX, TROPONINI in the last 168 hours. BNP (last 3 results) No results for input(s): PROBNP in the last 8760 hours. HbA1C: No results for  input(s): HGBA1C in the last 72 hours. CBG: No results for input(s): GLUCAP in the last 168 hours. Lipid Profile: No results for input(s): CHOL, HDL, LDLCALC, TRIG, CHOLHDL, LDLDIRECT in the last 72 hours. Thyroid Function Tests: No results for input(s): TSH, T4TOTAL, FREET4, T3FREE, THYROIDAB in the last 72 hours. Anemia Panel: No results for input(s): VITAMINB12, FOLATE, FERRITIN, TIBC, IRON, RETICCTPCT in the last 72 hours. Sepsis Labs: No results for input(s): PROCALCITON, LATICACIDVEN in the last 168 hours.  Recent Results (from the past 240 hour(s))  MRSA PCR Screening     Status: None   Collection Time: 11/14/16  9:16 PM  Result Value Ref Range Status   MRSA by PCR NEGATIVE NEGATIVE Final    Comment:        The GeneXpert MRSA Assay (FDA approved for NASAL specimens only), is one component of a comprehensive MRSA colonization surveillance program. It is not intended to diagnose MRSA infection nor to guide or monitor treatment for MRSA infections.          Radiology Studies: Ct Chest Wo Contrast  Result Date: 11/15/2016 CLINICAL DATA:  Spontaneous right pneumothorax 3 days ago, history of smoking. Patient "denies any chest pain, shortness of breath" EXAM: CT CHEST WITHOUT CONTRAST TECHNIQUE: Multidetector CT imaging of the chest was performed following the standard protocol without IV contrast. COMPARISON:  Chest x-ray dated 11/14/2016. FINDINGS: Cardiovascular: Heart size is normal. No pericardial effusion. Thoracic aorta is normal in caliber. Mediastinum/Nodes: No mass or enlarged lymph nodes within the mediastinum or perihilar regions. Lungs/Pleura: Right-sided chest tube in place with tip directed towards the right lung apex posteriorly. Tiny residual pneumothorax along the anteromedial aspects of the right upper lobe and anterior to the right middle lobe. Emphysematous changes within the upper lobes bilaterally, right greater than left, with associated emphysematous  blebs. Dense consolidation at the left lung base posteriorly, pneumonia versus airspace collapse. Upper Abdomen: No acute findings.  Status post cholecystectomy. Musculoskeletal: No acute or significant osseous finding. IMPRESSION: 1. Tiny residual pneumothorax along the anteromedial aspects of the right upper lobe and right middle lobe. Right-sided chest tube in place with tip directed towards the right lung apex posteriorly. 2. Emphysema, upper lobe predominant, right greater the left, with associated emphysematous blebs at each lung apex. 3. Dense consolidation at the left lung base posteriorly pneumonia versus airspace collapse. Recommend follow-up to ensure resolution. Electronically Signed   By: Bary Richard M.D.   On: 11/15/2016 12:22   Dg Chest Port 1 View  Result Date: 11/14/2016 CLINICAL DATA:  Right-sided chest tube in place for pneumothorax. EXAM: PORTABLE CHEST 1 VIEW COMPARISON:  None. FINDINGS: There is a tiny apical pneumothorax measuring 7 mm in thickness. Right-sided chest tube extends up to the posterior right fourth rib level. No mediastinal shift is seen. Heart is normal in size. No aortic aneurysm. Elevated left  hemidiaphragm is noted with left basilar atelectasis. No acute osseous appearing abnormality is identified. IMPRESSION: Tiny apical pneumothorax of less than 5% measuring 7 mm in thickness. Elevated appearing left hemidiaphragm versus eventration of the diaphragm with left basilar atelectasis. Electronically Signed   By: Tollie Eth M.D.   On: 11/14/2016 22:55        Scheduled Meds: . nicotine  21 mg Transdermal Daily  . NIFEdipine  60 mg Oral Daily  . prenatal multivitamin  1 tablet Oral Q1200  . senna-docusate  1 tablet Oral BID  . sertraline  50 mg Oral QHS  . sodium chloride flush  3 mL Intravenous Q12H   Continuous Infusions: . sodium chloride 75 mL/hr at 11/15/16 0700     LOS: 1 day    Time spent: 35 mins    Julissa Browning Joline Maxcy, MD [facility] Pager [phone]   If 7PM-7AM, please contact night-coverage [email or web address] Password [facility] 11/15/2016, 12:56 PM

## 2016-11-15 NOTE — Progress Notes (Signed)
Chest tube had continuous bubbling in air leak chamber. Upon inspection, found drainage system was disconnected from chest tube. 5-in-one connector was re-tapped. Bubbling has stopped. No evidence of air leak at this time. Will continue to monitor.

## 2016-11-16 ENCOUNTER — Encounter (HOSPITAL_COMMUNITY): Payer: Self-pay | Admitting: *Deleted

## 2016-11-16 ENCOUNTER — Inpatient Hospital Stay (HOSPITAL_COMMUNITY): Payer: Medicaid Other

## 2016-11-16 LAB — URINALYSIS, ROUTINE W REFLEX MICROSCOPIC
Bilirubin Urine: NEGATIVE
Glucose, UA: NEGATIVE mg/dL
Hgb urine dipstick: NEGATIVE
Ketones, ur: NEGATIVE mg/dL
Nitrite: NEGATIVE
Protein, ur: NEGATIVE mg/dL
Specific Gravity, Urine: 1.012 (ref 1.005–1.030)
pH: 6 (ref 5.0–8.0)

## 2016-11-16 LAB — BLOOD GAS, ARTERIAL
Acid-base deficit: 3.5 mmol/L — ABNORMAL HIGH (ref 0.0–2.0)
Bicarbonate: 20.1 mmol/L (ref 20.0–28.0)
Drawn by: 10552
O2 Saturation: 96.8 %
Patient temperature: 98.6
pCO2 arterial: 30.9 mmHg — ABNORMAL LOW (ref 32.0–48.0)
pH, Arterial: 7.429 (ref 7.350–7.450)
pO2, Arterial: 89.7 mmHg (ref 83.0–108.0)

## 2016-11-16 LAB — CBC
HCT: 43.8 % (ref 36.0–46.0)
Hemoglobin: 14.6 g/dL (ref 12.0–15.0)
MCH: 28.1 pg (ref 26.0–34.0)
MCHC: 33.3 g/dL (ref 30.0–36.0)
MCV: 84.2 fL (ref 78.0–100.0)
Platelets: 283 10*3/uL (ref 150–400)
RBC: 5.2 MIL/uL — ABNORMAL HIGH (ref 3.87–5.11)
RDW: 13.8 % (ref 11.5–15.5)
WBC: 8.6 10*3/uL (ref 4.0–10.5)

## 2016-11-16 LAB — COMPREHENSIVE METABOLIC PANEL
ALT: 22 U/L (ref 14–54)
AST: 28 U/L (ref 15–41)
Albumin: 3.4 g/dL — ABNORMAL LOW (ref 3.5–5.0)
Alkaline Phosphatase: 65 U/L (ref 38–126)
Anion gap: 9 (ref 5–15)
BUN: 5 mg/dL — ABNORMAL LOW (ref 6–20)
CO2: 21 mmol/L — ABNORMAL LOW (ref 22–32)
Calcium: 9.5 mg/dL (ref 8.9–10.3)
Chloride: 108 mmol/L (ref 101–111)
Creatinine, Ser: 0.82 mg/dL (ref 0.44–1.00)
GFR calc Af Amer: >60 mL/min (ref >60)
GFR calc non Af Amer: >60 mL/min (ref >60)
Glucose, Bld: 103 mg/dL — ABNORMAL HIGH (ref 65–99)
Potassium: 3.4 mmol/L — ABNORMAL LOW (ref 3.5–5.1)
Sodium: 138 mmol/L (ref 135–145)
Total Bilirubin: 0.3 mg/dL (ref 0.3–1.2)
Total Protein: 7.4 g/dL (ref 6.5–8.1)

## 2016-11-16 LAB — ABO/RH: ABO/RH(D): A POS

## 2016-11-16 LAB — TYPE AND SCREEN
ABO/RH(D): A POS
Antibody Screen: NEGATIVE

## 2016-11-16 LAB — APTT: aPTT: 30 seconds (ref 24–36)

## 2016-11-16 LAB — PROTIME-INR
INR: 1.01
Prothrombin Time: 13.3 seconds (ref 11.4–15.2)

## 2016-11-16 MED ORDER — IPRATROPIUM-ALBUTEROL 0.5-2.5 (3) MG/3ML IN SOLN: 3.0000 mL | RESPIRATORY_TRACT | Status: DC | PRN

## 2016-11-16 MED ORDER — PAROXETINE HCL 20 MG PO TABS
40.0000 mg | ORAL_TABLET | Freq: Every day | ORAL | Status: DC
  Administered 2016-11-16: 40 mg via ORAL
  Filled 2016-11-16 (×2): qty 2

## 2016-11-16 MED ORDER — BUPROPION HCL ER (XL) 150 MG PO TB24
150.0000 mg | ORAL_TABLET | Freq: Every day | ORAL | Status: DC
  Administered 2016-11-16: 150 mg via ORAL
  Filled 2016-11-16 (×2): qty 1

## 2016-11-16 MED ORDER — DEXTROSE 5 % IV SOLN
1.5000 g | INTRAVENOUS | Status: AC
  Administered 2016-11-17: 1.5 g via INTRAVENOUS
  Filled 2016-11-16: qty 1.5

## 2016-11-16 NOTE — Progress Notes (Signed)
Pt states zoloft makes her suicidal and request to be placed back onto home paxil and wellbutrin, MD Amin notified via text page

## 2016-11-16 NOTE — Progress Notes (Signed)
PROGRESS NOTE    MOXIE KALIL  ZOX:[id number] DOB: May 15, 1981 DOA: 11/14/2016 PCP: No PCP Per Patient   Brief Narrative:  36 year old female with past medical history of spontaneous pneumothorax, hypertension, depression, anxiety, tobacco use came to the ER with the complaints of shortness of breath at [facility]. She was transferred here for further care for chest tube management and cardiac thoracic surgery eval.   Assessment & Plan:   Principal Problem:   Pneumothorax Active Problems:   Essential hypertension   Depression   Tobacco abuse   Constipation  Recurrent spontaneous pneumothorax-concerns for tension pneumothorax -Currently she has chest tube in place and no leak has been noted  -cardiac thoracic surgery is being consulted -Pain control and Mucinex for cough -CT of the chest shows tiny residual pneumothorax in right upper and middle lobe with chest tube in place. She also has emphysematous changes upper lobes predominantly right greater than left with blebs at each apex. -I suspect she is underlying COPD given her heavy tobacco use. She needs to follow-up outpatient with pulmonary. -nebs prn  -possible VATS- defer to CVTS  Hypertension -Continue nifedipine and she can get hydralazine as needed  Depression -Continue Zoloft  Tobacco use -nicotine patch    DVT prophylaxis: SCDs Code Status: Full Family Communication:  Patient comprehends well Disposition Plan: Maintain inpatient stay at this time.  Consultants:   Cardiothoracic surgery  Procedures:   Chest tube placed on 11/14/2016  Antimicrobials:   None   Subjective: No complaints. Feels little better. Her pain is better as well.    Pulse: 84 68    Resp: 19 19    Temp: 98.2 F (36.8 C) 98.4 F (36.9 C) 98.5 F (36.9 C)   TempSrc: Oral Oral Oral   SpO2: 97% 98% 95% 99%  Weight:      Height:      Patient states  Intake/Output Summary (Last 24 hours) at 11/15/16 1256 Last data filed at  11/15/16 1100  Gross per 24 hour  Intake          1151.25 ml  Output              508 ml  Net           643.25 ml   Filed Weights   11/14/16 2114  Weight: 85.1 kg (187 lb 9.8 oz)    Examination:  General exam: Appears calm and comfortable  Respiratory system: Clear to auscultation. Respiratory effort normal. Cardiovascular system: S1 & S2 heard, RRR. No JVD, murmurs, rubs, gallops or clicks. No pedal edema. Gastrointestinal system: Abdomen is nondistended, soft and nontender. No organomegaly or masses felt. Normal bowel sounds heard. Central nervous system: Alert and oriented. No focal neurological deficits. Extremities: Symmetric 5 x 5 power. Skin: No rashes, lesions or ulcers Psychiatry: Judgement and insight appear normal. Mood & affect appropriate.   Right-sided chest wall chest tube noted. No signs of active bleeding at the site or infection. tender to palpation around the site as expected  Data Reviewed:   CBC:  Recent Labs Lab 11/14/16 2320 11/15/16 0314  WBC 5.9 6.5  NEUTROABS 3.4  --   HGB 12.2 14.3  HCT 38.3 44.3  MCV 84.9 85.7  PLT 236 299   Basic Metabolic Panel:  Recent Labs Lab 11/14/16 2320 11/15/16 0314  NA 137 138  K 3.5 3.6  CL 106 105  CO2 20* 23  GLUCOSE 100* 87  BUN 5* 5*  CREATININE 0.78 0.81  CALCIUM 8.8*  9.1   GFR: Estimated Creatinine Clearance: 111.9 mL/min (by C-G formula based on SCr of 0.81 mg/dL). Liver Function Tests: No results for input(s): AST, ALT, ALKPHOS, BILITOT, PROT, ALBUMIN in the last 168 hours. No results for input(s): LIPASE, AMYLASE in the last 168 hours. No results for input(s): AMMONIA in the last 168 hours. Coagulation Profile: No results for input(s): INR, PROTIME in the last 168 hours. Cardiac Enzymes: No results for input(s): CKTOTAL, CKMB, CKMBINDEX, TROPONINI in the last 168 hours. BNP (last 3 results) No results for input(s): PROBNP in the last 8760 hours. HbA1C: No results for input(s): HGBA1C in  the last 72 hours. CBG: No results for input(s): GLUCAP in the last 168 hours. Lipid Profile: No results for input(s): CHOL, HDL, LDLCALC, TRIG, CHOLHDL, LDLDIRECT in the last 72 hours. Thyroid Function Tests: No results for input(s): TSH, T4TOTAL, FREET4, T3FREE, THYROIDAB in the last 72 hours. Anemia Panel: No results for input(s): VITAMINB12, FOLATE, FERRITIN, TIBC, IRON, RETICCTPCT in the last 72 hours. Sepsis Labs: No results for input(s): PROCALCITON, LATICACIDVEN in the last 168 hours.  Recent Results (from the past 240 hour(s))  MRSA PCR Screening     Status: None   Collection Time: 11/14/16  9:16 PM  Result Value Ref Range Status   MRSA by PCR NEGATIVE NEGATIVE Final    Comment:        The GeneXpert MRSA Assay (FDA approved for NASAL specimens only), is one component of a comprehensive MRSA colonization surveillance program. It is not intended to diagnose MRSA infection nor to guide or monitor treatment for MRSA infections.          Radiology Studies: Ct Chest Wo Contrast  Result Date: 11/15/2016 CLINICAL DATA:  Spontaneous right pneumothorax 3 days ago, history of smoking. Patient "denies any chest pain, shortness of breath" EXAM: CT CHEST WITHOUT CONTRAST TECHNIQUE: Multidetector CT imaging of the chest was performed following the standard protocol without IV contrast. COMPARISON:  Chest x-ray dated 11/14/2016. FINDINGS: Cardiovascular: Heart size is normal. No pericardial effusion. Thoracic aorta is normal in caliber. Mediastinum/Nodes: No mass or enlarged lymph nodes within the mediastinum or perihilar regions. Lungs/Pleura: Right-sided chest tube in place with tip directed towards the right lung apex posteriorly. Tiny residual pneumothorax along the anteromedial aspects of the right upper lobe and anterior to the right middle lobe. Emphysematous changes within the upper lobes bilaterally, right greater than left, with associated emphysematous blebs. Dense  consolidation at the left lung base posteriorly, pneumonia versus airspace collapse. Upper Abdomen: No acute findings.  Status post cholecystectomy. Musculoskeletal: No acute or significant osseous finding. IMPRESSION: 1. Tiny residual pneumothorax along the anteromedial aspects of the right upper lobe and right middle lobe. Right-sided chest tube in place with tip directed towards the right lung apex posteriorly. 2. Emphysema, upper lobe predominant, right greater the left, with associated emphysematous blebs at each lung apex. 3. Dense consolidation at the left lung base posteriorly pneumonia versus airspace collapse. Recommend follow-up to ensure resolution. Electronically Signed   By: Bary Richard M.D.   On: 11/15/2016 12:22   Dg Chest Port 1 View  Result Date: 11/14/2016 CLINICAL DATA:  Right-sided chest tube in place for pneumothorax. EXAM: PORTABLE CHEST 1 VIEW COMPARISON:  None. FINDINGS: There is a tiny apical pneumothorax measuring 7 mm in thickness. Right-sided chest tube extends up to the posterior right fourth rib level. No mediastinal shift is seen. Heart is normal in size. No aortic aneurysm. Elevated left hemidiaphragm is noted with left  basilar atelectasis. No acute osseous appearing abnormality is identified. IMPRESSION: Tiny apical pneumothorax of less than 5% measuring 7 mm in thickness. Elevated appearing left hemidiaphragm versus eventration of the diaphragm with left basilar atelectasis. Electronically Signed   By: Tollie Eth M.D.   On: 11/14/2016 22:55        Scheduled Meds: . nicotine  21 mg Transdermal Daily  . NIFEdipine  60 mg Oral Daily  . prenatal multivitamin  1 tablet Oral Q1200  . senna-docusate  1 tablet Oral BID  . sertraline  50 mg Oral QHS  . sodium chloride flush  3 mL Intravenous Q12H   Continuous Infusions: . sodium chloride 75 mL/hr at 11/15/16 0700     LOS: 1 day    Time spent: 35 mins    Janitza Revuelta Joline Maxcy, MD [facility] Pager  [phone]   If 7PM-7AM, please contact night-coverage [email or web address] Password [facility] 11/15/2016, 12:56 PM

## 2016-11-16 NOTE — Progress Notes (Addendum)
[address]       [name] [address]             [phone]           Subjective: Patient just ate breakfast. No complaints this am.  Objective: Vital signs in last 24 hours: Temp:  [98.2 F (36.8 C)-98.5 F (36.9 C)] 98.2 F (36.8 C) (04/08 0736) Pulse Rate:  [64-99] 79 (04/08 0736) Cardiac Rhythm: Normal sinus rhythm (04/08 0435) Resp:  [14-23] 19 (04/08 0736) BP: (115-153)/(77-105) 140/90 (04/08 0736) SpO2:  [92 %-99 %] 94 % (04/08 0736)      Intake/Output from previous day: 04/07 0701 - 04/08 0700 In: 2185 [P.O.:460; I.V.:1725] Out: 0    Physical Exam:  Cardiovascular: RRR Pulmonary: Slightly diminished but clear bilaterally Chest Tube: to water seal, no air leak  Lab Results: CBC: Recent Labs  11/14/16 2320 11/15/16 0314  WBC 5.9 6.5  HGB 12.2 14.3  HCT 38.3 44.3  PLT 236 299   BMET:  Recent Labs  11/14/16 2320 11/15/16 0314  NA 137 138  K 3.5 3.6  CL 106 105  CO2 20* 23  GLUCOSE 100* 87  BUN 5* 5*  CREATININE 0.78 0.81  CALCIUM 8.8* 9.1    PT/INR: No results for input(s): LABPROT, INR in the last 72 hours. ABG:  INR: Will add last result for INR, ABG once components are confirmed Will add last 4 CBG results once components are confirmed  Dg Chest Port 1 View  Result Date: 11/16/2016 CLINICAL DATA:  Right-sided pneumothorax.  Chest tube placement. EXAM: PORTABLE CHEST 1 VIEW COMPARISON:  November 15, 2016 FINDINGS: The right-sided chest tube is in stable position. A small right apical pneumothorax measures 10 mm today versus 7 mm yesterday, similar in the interval. No left-sided pneumothorax identified. Opacity in the left base is similar in the interval an was better appreciated on the CT scan from yesterday as dense consolidation. The cardiomediastinal silhouette is stable. No other changes. IMPRESSION: 1. Stable right chest tube. The tiny right apical pneumothorax measures 10 mm today versus 7 mm yesterday, not  significantly changed. 2. Continued consolidation in the left base. Electronically Signed   By: Gerome Sam III M.D   On: 11/16/2016 07:22   Ct Chest Wo Contrast  Result Date: 11/15/2016 CLINICAL DATA:  Spontaneous right pneumothorax 3 days ago, history of smoking. Patient "denies any chest pain, shortness of breath" EXAM: CT CHEST WITHOUT CONTRAST TECHNIQUE: Multidetector CT imaging of the chest was performed following the standard protocol without IV contrast. COMPARISON:  Chest x-ray dated 11/14/2016. FINDINGS: Cardiovascular: Heart size is normal. No pericardial effusion. Thoracic aorta is normal in caliber. Mediastinum/Nodes: No mass or enlarged lymph nodes within the mediastinum or perihilar regions. Lungs/Pleura: Right-sided chest tube in place with tip directed towards the right lung apex posteriorly. Tiny residual pneumothorax along the anteromedial aspects of the right upper lobe and anterior to the right middle lobe. Emphysematous changes within the upper lobes bilaterally, right greater than left, with associated emphysematous blebs. Dense consolidation at the left lung base posteriorly, pneumonia versus airspace collapse. Upper Abdomen: No acute findings.  Status post cholecystectomy. Musculoskeletal: No acute or significant osseous finding. IMPRESSION: 1. Tiny residual pneumothorax along the anteromedial aspects of the right upper lobe and right middle lobe. Right-sided chest tube in place with tip directed towards the right lung apex posteriorly. 2. Emphysema, upper lobe predominant, right greater the left, with associated emphysematous blebs at  each lung apex. 3. Dense consolidation at the left lung base posteriorly pneumonia versus airspace collapse. Recommend follow-up to ensure resolution. Electronically Signed   By: Bary Richard M.D.   On: 11/15/2016 12:22   Assessment/Plan:  1. CV - SR in the 70's. 2.  Pulmonary - Chest tube is to water seal, no air leak. CT scan done yesterday  showed tiny right pneumothorax, emphysema R>L with blebs at both apices, and dense consolidation LLL. CXR this am shows tiny,stable right apical pneumothorax and consolidation left base. She will likely need right VATS. Dr. Tyrone Sage to evaluate.   ZIMMERMAN,DONIELLE MPA-C 11/16/2016,8:25 AM  CT reviewed with patient, with recurrent ptx on the right ( 2 in 8 months) and presence of associated emphysematous blebs at each lung apex I have recommended to the patient proceeding with bronchoscopy and right VATS and stabling of apical blebs . Risk of recurrent PTX reviewed. Patient agreeable with proceeding The goals risks and alternatives of the planned surgical procedure Bronchoscopy, right VATS stapling of Blebs   have been discussed with the patient in detail. The risks of the procedure including death, infection, stroke, myocardial infarction, bleeding, blood transfusion have all been discussed specifically.  I have quoted [name] a 2 % of perioperative mortality and a complication rate as high as 30%. The patient's questions have been answered.REYANNA BALEY is willing  to proceed with the planned procedure.  I have seen and examined [name] and agree with the above assessment  and plan.  Delight Ovens MD Beeper [phone] Office [phone] 11/16/2016 12:57 PM

## 2016-11-17 ENCOUNTER — Inpatient Hospital Stay (HOSPITAL_COMMUNITY): Payer: Medicaid Other | Admitting: Anesthesiology

## 2016-11-17 ENCOUNTER — Encounter (HOSPITAL_COMMUNITY): Payer: Self-pay | Admitting: Certified Registered"

## 2016-11-17 ENCOUNTER — Encounter (HOSPITAL_COMMUNITY)
Admission: AD | Disposition: A | Discharge status: Home / Self Care | Payer: Self-pay | Source: Other Acute Inpatient Hospital | Attending: Cardiothoracic Surgery

## 2016-11-17 ENCOUNTER — Inpatient Hospital Stay (HOSPITAL_COMMUNITY): Payer: Medicaid Other

## 2016-11-17 DIAGNOSIS — Z09 Encounter for follow-up examination after completed treatment for conditions other than malignant neoplasm: Secondary | ICD-10-CM

## 2016-11-17 HISTORY — PX: VIDEO BRONCHOSCOPY: SHX5072

## 2016-11-17 HISTORY — PX: VIDEO ASSISTED THORACOSCOPY (VATS)/WEDGE RESECTION: SHX6174

## 2016-11-17 LAB — BASIC METABOLIC PANEL
Anion gap: 10 (ref 5–15)
BUN: 5 mg/dL — ABNORMAL LOW (ref 6–20)
CO2: 22 mmol/L (ref 22–32)
Calcium: 8.7 mg/dL — ABNORMAL LOW (ref 8.9–10.3)
Chloride: 105 mmol/L (ref 101–111)
Creatinine, Ser: 0.82 mg/dL (ref 0.44–1.00)
GFR calc Af Amer: >60 mL/min (ref >60)
GFR calc non Af Amer: >60 mL/min (ref >60)
Glucose, Bld: 96 mg/dL (ref 65–99)
Potassium: 3.9 mmol/L (ref 3.5–5.1)
Sodium: 137 mmol/L (ref 135–145)

## 2016-11-17 LAB — GLUCOSE, CAPILLARY
GLUCOSE-CAPILLARY: 101 mg/dL — AB (ref 65–99)
Glucose-Capillary: 93 mg/dL (ref 65–99)
Glucose-Capillary: 157 mg/dL — ABNORMAL HIGH (ref 65–99)

## 2016-11-17 LAB — CBC
HCT: 42.1 % (ref 36.0–46.0)
Hemoglobin: 13.9 g/dL (ref 12.0–15.0)
MCH: 28 pg (ref 26.0–34.0)
MCHC: 33 g/dL (ref 30.0–36.0)
MCV: 84.9 fL (ref 78.0–100.0)
Platelets: 291 10*3/uL (ref 150–400)
RBC: 4.96 MIL/uL (ref 3.87–5.11)
RDW: 14.4 % (ref 11.5–15.5)
WBC: 15.6 10*3/uL — ABNORMAL HIGH (ref 4.0–10.5)

## 2016-11-17 LAB — APTT: aPTT: 30 seconds (ref 24–36)

## 2016-11-17 LAB — SURGICAL PCR SCREEN
MRSA, PCR: NEGATIVE
Staphylococcus aureus: NEGATIVE

## 2016-11-17 SURGERY — BRONCHOSCOPY, VIDEO-ASSISTED
Anesthesia: General | Site: Chest | Laterality: Right

## 2016-11-17 MED ORDER — DEXTROSE 5 % IV SOLN
1.5000 g | Freq: Two times a day (BID) | INTRAVENOUS | Status: AC
  Administered 2016-11-17 – 2016-11-18 (×2): 1.5 g via INTRAVENOUS
  Filled 2016-11-17 (×2): qty 1.5

## 2016-11-17 MED ORDER — SUGAMMADEX SODIUM 200 MG/2ML IV SOLN
INTRAVENOUS | Status: DC | PRN
  Administered 2016-11-17: 170 mg via INTRAVENOUS

## 2016-11-17 MED ORDER — LACTATED RINGERS IV SOLN
INTRAVENOUS | Status: DC
  Administered 2016-11-17: 12:00:00 via INTRAVENOUS

## 2016-11-17 MED ORDER — LIDOCAINE 2% (20 MG/ML) 5 ML SYRINGE
INTRAMUSCULAR | Status: AC
  Filled 2016-11-17: qty 5

## 2016-11-17 MED ORDER — SODIUM CHLORIDE 0.9% FLUSH: 9.0000 mL | INTRAVENOUS | Status: DC | PRN

## 2016-11-17 MED ORDER — BISACODYL 5 MG PO TBEC
10.0000 mg | DELAYED_RELEASE_TABLET | Freq: Every day | ORAL | Status: DC
  Administered 2016-11-18 – 2016-11-19 (×2): 10 mg via ORAL
  Filled 2016-11-17 (×3): qty 2

## 2016-11-17 MED ORDER — MIDAZOLAM HCL 2 MG/2ML IJ SOLN
INTRAMUSCULAR | Status: DC | PRN
  Administered 2016-11-17 (×2): 1 mg via INTRAVENOUS

## 2016-11-17 MED ORDER — ACETAMINOPHEN 500 MG PO TABS
1000.0000 mg | ORAL_TABLET | Freq: Four times a day (QID) | ORAL | Status: DC
  Administered 2016-11-17 – 2016-11-20 (×10): 1000 mg via ORAL
  Filled 2016-11-17 (×10): qty 2

## 2016-11-17 MED ORDER — SENNOSIDES-DOCUSATE SODIUM 8.6-50 MG PO TABS
1.0000 | ORAL_TABLET | Freq: Every day | ORAL | Status: DC
  Administered 2016-11-17: 1 via ORAL
  Filled 2016-11-17 (×2): qty 1

## 2016-11-17 MED ORDER — PROPOFOL 10 MG/ML IV BOLUS
INTRAVENOUS | Status: DC | PRN
  Administered 2016-11-17: 100 mg via INTRAVENOUS
  Administered 2016-11-17: 80 mg via INTRAVENOUS

## 2016-11-17 MED ORDER — SODIUM CHLORIDE 0.9 % IV SOLN
30.0000 meq | Freq: Every day | INTRAVENOUS | Status: DC | PRN
  Administered 2016-11-18: 30 meq via INTRAVENOUS
  Filled 2016-11-17 (×3): qty 15

## 2016-11-17 MED ORDER — NALOXONE HCL 0.4 MG/ML IJ SOLN: 0.4000 mg | INTRAMUSCULAR | Status: DC | PRN

## 2016-11-17 MED ORDER — ROCURONIUM BROMIDE 100 MG/10ML IV SOLN
INTRAVENOUS | Status: DC | PRN
  Administered 2016-11-17: 50 mg via INTRAVENOUS
  Administered 2016-11-17: 30 mg via INTRAVENOUS

## 2016-11-17 MED ORDER — FENTANYL CITRATE (PF) 100 MCG/2ML IJ SOLN
25.0000 ug | INTRAMUSCULAR | Status: DC | PRN
  Administered 2016-11-17 (×2): 50 ug via INTRAVENOUS

## 2016-11-17 MED ORDER — DIPHENHYDRAMINE HCL 50 MG/ML IJ SOLN: 12.5000 mg | Freq: Four times a day (QID) | INTRAMUSCULAR | Status: DC | PRN

## 2016-11-17 MED ORDER — LEVALBUTEROL HCL 0.63 MG/3ML IN NEBU
0.6300 mg | INHALATION_SOLUTION | Freq: Three times a day (TID) | RESPIRATORY_TRACT | Status: DC
  Administered 2016-11-18: 0.63 mg via RESPIRATORY_TRACT
  Filled 2016-11-17: qty 3

## 2016-11-17 MED ORDER — FENTANYL CITRATE (PF) 250 MCG/5ML IJ SOLN
INTRAMUSCULAR | Status: AC
  Filled 2016-11-17: qty 5

## 2016-11-17 MED ORDER — FENTANYL CITRATE (PF) 100 MCG/2ML IJ SOLN
INTRAMUSCULAR | Status: AC
  Filled 2016-11-17: qty 2

## 2016-11-17 MED ORDER — BUPROPION HCL ER (XL) 150 MG PO TB24
150.0000 mg | ORAL_TABLET | Freq: Every day | ORAL | Status: DC
  Administered 2016-11-18 – 2016-11-20 (×3): 150 mg via ORAL
  Filled 2016-11-17 (×3): qty 1

## 2016-11-17 MED ORDER — MIDAZOLAM HCL 2 MG/2ML IJ SOLN
INTRAMUSCULAR | Status: AC
  Filled 2016-11-17: qty 2

## 2016-11-17 MED ORDER — SUGAMMADEX SODIUM 200 MG/2ML IV SOLN
INTRAVENOUS | Status: AC
  Filled 2016-11-17: qty 2

## 2016-11-17 MED ORDER — TRAMADOL HCL 50 MG PO TABS: 50.0000 mg | ORAL_TABLET | Freq: Four times a day (QID) | ORAL | Status: DC | PRN

## 2016-11-17 MED ORDER — FENTANYL CITRATE (PF) 100 MCG/2ML IJ SOLN: 25.0000 ug | INTRAMUSCULAR | Status: DC | PRN

## 2016-11-17 MED ORDER — FENTANYL CITRATE (PF) 100 MCG/2ML IJ SOLN
INTRAMUSCULAR | Status: DC | PRN
  Administered 2016-11-17 (×3): 50 ug via INTRAVENOUS
  Administered 2016-11-17: 100 ug via INTRAVENOUS
  Administered 2016-11-17 (×2): 50 ug via INTRAVENOUS
  Administered 2016-11-17: 100 ug via INTRAVENOUS
  Administered 2016-11-17 (×6): 50 ug via INTRAVENOUS

## 2016-11-17 MED ORDER — IPRATROPIUM-ALBUTEROL 0.5-2.5 (3) MG/3ML IN SOLN: 3.0000 mL | RESPIRATORY_TRACT | Status: DC | PRN

## 2016-11-17 MED ORDER — LACTATED RINGERS IV SOLN
INTRAVENOUS | Status: DC | PRN
  Administered 2016-11-17: 12:00:00 via INTRAVENOUS

## 2016-11-17 MED ORDER — DEXTROSE-NACL 5-0.45 % IV SOLN
INTRAVENOUS | Status: DC
  Administered 2016-11-17: 17:00:00 via INTRAVENOUS

## 2016-11-17 MED ORDER — DIPHENHYDRAMINE HCL 12.5 MG/5ML PO ELIX: 12.5000 mg | ORAL_SOLUTION | Freq: Four times a day (QID) | ORAL | Status: DC | PRN

## 2016-11-17 MED ORDER — LEVALBUTEROL HCL 0.63 MG/3ML IN NEBU
0.6300 mg | INHALATION_SOLUTION | Freq: Four times a day (QID) | RESPIRATORY_TRACT | Status: DC
  Administered 2016-11-17 (×2): 0.63 mg via RESPIRATORY_TRACT
  Filled 2016-11-17 (×2): qty 3

## 2016-11-17 MED ORDER — ROCURONIUM BROMIDE 50 MG/5ML IV SOSY
PREFILLED_SYRINGE | INTRAVENOUS | Status: AC
  Filled 2016-11-17: qty 5

## 2016-11-17 MED ORDER — OXYCODONE HCL 5 MG/5ML PO SOLN: 5.0000 mg | Freq: Once | ORAL | Status: DC | PRN

## 2016-11-17 MED ORDER — PAROXETINE HCL 20 MG PO TABS
40.0000 mg | ORAL_TABLET | Freq: Every day | ORAL | Status: DC
  Administered 2016-11-18 – 2016-11-20 (×3): 40 mg via ORAL
  Filled 2016-11-17 (×3): qty 2

## 2016-11-17 MED ORDER — 0.9 % SODIUM CHLORIDE (POUR BTL) OPTIME
TOPICAL | Status: DC | PRN
  Administered 2016-11-17: 2000 mL

## 2016-11-17 MED ORDER — PROPOFOL 10 MG/ML IV BOLUS
INTRAVENOUS | Status: AC
  Filled 2016-11-17: qty 20

## 2016-11-17 MED ORDER — OXYCODONE HCL 5 MG PO TABS: 5.0000 mg | ORAL_TABLET | Freq: Once | ORAL | Status: DC | PRN

## 2016-11-17 MED ORDER — FENTANYL 40 MCG/ML IV SOLN
INTRAVENOUS | Status: DC
Start: 2016-11-17 — End: 2016-11-18
  Administered 2016-11-17: 15:00:00 via INTRAVENOUS
  Administered 2016-11-17: 285 ug via INTRAVENOUS
  Administered 2016-11-18: 13:00:00 via INTRAVENOUS
  Administered 2016-11-18: 15 ug via INTRAVENOUS
  Administered 2016-11-18: 13 ug via INTRAVENOUS
  Administered 2016-11-18: 135 ug via INTRAVENOUS
  Administered 2016-11-18: 180 ug via INTRAVENOUS
  Filled 2016-11-17 (×4): qty 25

## 2016-11-17 MED ORDER — ONDANSETRON HCL 4 MG/2ML IJ SOLN: 4.0000 mg | Freq: Once | INTRAMUSCULAR | Status: DC | PRN

## 2016-11-17 MED ORDER — OXYCODONE HCL 5 MG PO TABS
5.0000 mg | ORAL_TABLET | ORAL | Status: DC | PRN
  Administered 2016-11-18: 10 mg via ORAL
  Administered 2016-11-19 (×2): 5 mg via ORAL
  Administered 2016-11-19 – 2016-11-20 (×3): 10 mg via ORAL
  Administered 2016-11-20: 5 mg via ORAL
  Filled 2016-11-17: qty 1
  Filled 2016-11-17 (×2): qty 2
  Filled 2016-11-17 (×4): qty 1
  Filled 2016-11-17: qty 2

## 2016-11-17 MED ORDER — ONDANSETRON HCL 4 MG/2ML IJ SOLN
INTRAMUSCULAR | Status: AC
  Filled 2016-11-17: qty 2

## 2016-11-17 MED ORDER — ONDANSETRON HCL 4 MG/2ML IJ SOLN
INTRAMUSCULAR | Status: DC | PRN
  Administered 2016-11-17: 4 mg via INTRAVENOUS

## 2016-11-17 MED ORDER — INSULIN ASPART 100 UNIT/ML ~~LOC~~ SOLN
0.0000 [IU] | SUBCUTANEOUS | Status: DC
  Administered 2016-11-17: 2 [IU] via SUBCUTANEOUS

## 2016-11-17 MED ORDER — ACETAMINOPHEN 160 MG/5ML PO SOLN
1000.0000 mg | Freq: Four times a day (QID) | ORAL | Status: DC
  Administered 2016-11-19: 1000 mg via ORAL
  Filled 2016-11-17: qty 40.6

## 2016-11-17 MED ORDER — ONDANSETRON HCL 4 MG/2ML IJ SOLN: 4.0000 mg | Freq: Four times a day (QID) | INTRAMUSCULAR | Status: DC | PRN

## 2016-11-17 SURGICAL SUPPLY — 89 items
APPLICATOR TIP COSEAL (VASCULAR PRODUCTS) IMPLANT
APPLICATOR TIP EXT COSEAL (VASCULAR PRODUCTS) IMPLANT
BLADE SURG 11 STRL SS (BLADE) ×3 IMPLANT
BRUSH CYTOL CELLEBRITY 1.5X140 (MISCELLANEOUS) IMPLANT
CANISTER SUCT 3000ML PPV (MISCELLANEOUS) ×3 IMPLANT
CATH KIT ON Q 5IN SLV (PAIN MANAGEMENT) IMPLANT
CATH THORACIC 28FR (CATHETERS) ×3 IMPLANT
CATH THORACIC 36FR (CATHETERS) IMPLANT
CATH THORACIC 36FR RT ANG (CATHETERS) IMPLANT
CLEANER TIP ELECTROSURG 2X2 (MISCELLANEOUS) ×3 IMPLANT
CLIP TI MEDIUM 6 (CLIP) IMPLANT
CONN ST 1/4X3/8  BEN (MISCELLANEOUS)
CONN ST 1/4X3/8 BEN (MISCELLANEOUS) IMPLANT
CONT SPEC 4OZ CLIKSEAL STRL BL (MISCELLANEOUS) ×6 IMPLANT
COVER BACK TABLE 60X90IN (DRAPES) IMPLANT
DERMABOND ADVANCED (GAUZE/BANDAGES/DRESSINGS) ×1
DERMABOND ADVANCED .7 DNX12 (GAUZE/BANDAGES/DRESSINGS) ×2 IMPLANT
DRAIN CHANNEL 28F RND 3/8 FF (WOUND CARE) IMPLANT
DRAIN CHANNEL 32F RND 10.7 FF (WOUND CARE) IMPLANT
DRAPE LAPAROSCOPIC ABDOMINAL (DRAPES) ×3 IMPLANT
DRAPE WARM FLUID 44X44 (DRAPE) IMPLANT
DRILL BIT 7/64X5 (BIT) IMPLANT
ELECT BLADE 4.0 EZ CLEAN MEGAD (MISCELLANEOUS) ×3
ELECT REM PT RETURN 9FT ADLT (ELECTROSURGICAL) ×3
ELECTRODE BLDE 4.0 EZ CLN MEGD (MISCELLANEOUS) ×2 IMPLANT
ELECTRODE REM PT RTRN 9FT ADLT (ELECTROSURGICAL) ×2 IMPLANT
FORCEPS BIOP RJ4 1.8 (CUTTING FORCEPS) IMPLANT
GAUZE SPONGE 4X4 12PLY STRL (GAUZE/BANDAGES/DRESSINGS) ×3 IMPLANT
GAUZE SPONGE 4X4 12PLY STRL LF (GAUZE/BANDAGES/DRESSINGS) ×3 IMPLANT
GLOVE BIO SURGEON STRL SZ 6.5 (GLOVE) ×9 IMPLANT
GLOVE BIOGEL PI IND STRL 6 (GLOVE) ×8 IMPLANT
GLOVE BIOGEL PI IND STRL 6.5 (GLOVE) ×8 IMPLANT
GLOVE BIOGEL PI INDICATOR 6 (GLOVE) ×4
GLOVE BIOGEL PI INDICATOR 6.5 (GLOVE) ×4
GOWN STRL REUS W/ TWL LRG LVL3 (GOWN DISPOSABLE) ×12 IMPLANT
GOWN STRL REUS W/TWL LRG LVL3 (GOWN DISPOSABLE) ×6
KIT BASIN OR (CUSTOM PROCEDURE TRAY) ×3 IMPLANT
KIT CLEAN ENDO COMPLIANCE (KITS) IMPLANT
KIT ROOM TURNOVER OR (KITS) ×3 IMPLANT
KIT SUCTION CATH 14FR (SUCTIONS) ×3 IMPLANT
MARKER SKIN DUAL TIP RULER LAB (MISCELLANEOUS) IMPLANT
NEEDLE BIOPSY TRANSBRONCH 21G (NEEDLE) IMPLANT
NS IRRIG 1000ML POUR BTL (IV SOLUTION) ×6 IMPLANT
OIL SILICONE PENTAX (PARTS (SERVICE/REPAIRS)) ×3 IMPLANT
PACK CHEST (CUSTOM PROCEDURE TRAY) ×3 IMPLANT
PAD ARMBOARD 7.5X6 YLW CONV (MISCELLANEOUS) ×9 IMPLANT
PASSER SUT SWANSON 36MM LOOP (INSTRUMENTS) IMPLANT
SCISSORS LAP 5X35 DISP (ENDOMECHANICALS) IMPLANT
SEALANT SURG COSEAL 4ML (VASCULAR PRODUCTS) IMPLANT
SEALANT SURG COSEAL 8ML (VASCULAR PRODUCTS) IMPLANT
SOLUTION ANTI FOG 6CC (MISCELLANEOUS) ×3 IMPLANT
STAPLE RELOAD 45MM GOLD (STAPLE) ×6 IMPLANT
SUT ETHILON 3 0 FSL (SUTURE) ×3 IMPLANT
SUT PROLENE 3 0 SH DA (SUTURE) IMPLANT
SUT PROLENE 4 0 RB 1 (SUTURE)
SUT PROLENE 4-0 RB1 .5 CRCL 36 (SUTURE) IMPLANT
SUT SILK  1 MH (SUTURE) ×2
SUT SILK 1 MH (SUTURE) ×4 IMPLANT
SUT SILK 1 TIES 10X30 (SUTURE) IMPLANT
SUT SILK 2 0 SH (SUTURE) IMPLANT
SUT SILK 2 0SH CR/8 30 (SUTURE) IMPLANT
SUT SILK 3 0SH CR/8 30 (SUTURE) IMPLANT
SUT STEEL 1 (SUTURE) IMPLANT
SUT VIC AB 0 CTX 18 (SUTURE) IMPLANT
SUT VIC AB 1 CTX 18 (SUTURE) IMPLANT
SUT VIC AB 1 CTX 36 (SUTURE)
SUT VIC AB 1 CTX36XBRD ANBCTR (SUTURE) IMPLANT
SUT VIC AB 2-0 CTX 36 (SUTURE) IMPLANT
SUT VIC AB 2-0 UR6 27 (SUTURE) IMPLANT
SUT VIC AB 3-0 SH 8-18 (SUTURE) IMPLANT
SUT VIC AB 3-0 X1 27 (SUTURE) IMPLANT
SUT VICRYL 0 UR6 27IN ABS (SUTURE) ×3 IMPLANT
SUT VICRYL 2 TP 1 (SUTURE) IMPLANT
SWAB COLLECTION DEVICE MRSA (MISCELLANEOUS) IMPLANT
SWAB CULTURE ESWAB REG 1ML (MISCELLANEOUS) IMPLANT
SYR 20ML ECCENTRIC (SYRINGE) IMPLANT
SYSTEM SAHARA CHEST DRAIN ATS (WOUND CARE) ×3 IMPLANT
TAPE CLOTH SURG 4X10 WHT LF (GAUZE/BANDAGES/DRESSINGS) ×3 IMPLANT
TAPE UMBILICAL COTTON 1/8X30 (MISCELLANEOUS) IMPLANT
TOWEL GREEN STERILE (TOWEL DISPOSABLE) ×3 IMPLANT
TOWEL GREEN STERILE FF (TOWEL DISPOSABLE) ×3 IMPLANT
TOWEL OR 17X24 6PK STRL BLUE (TOWEL DISPOSABLE) IMPLANT
TOWEL OR 17X26 10 PK STRL BLUE (TOWEL DISPOSABLE) IMPLANT
TRAP SPECIMEN MUCOUS 40CC (MISCELLANEOUS) IMPLANT
TRAY FOLEY W/METER SILVER 16FR (SET/KITS/TRAYS/PACK) ×3 IMPLANT
TROCAR BLADELESS 12MM (ENDOMECHANICALS) IMPLANT
TUBE CONNECTING 20X1/4 (TUBING) IMPLANT
TUNNELER SHEATH ON-Q 11GX8 DSP (PAIN MANAGEMENT) IMPLANT
WATER STERILE IRR 1000ML POUR (IV SOLUTION) ×6 IMPLANT

## 2016-11-17 NOTE — Anesthesia Postprocedure Evaluation (Addendum)
Anesthesia Post Note  Patient: Robin Valenzuela  Procedure(s) Performed: Procedure(s) (LRB): VIDEO BRONCHOSCOPY (N/A) VIDEO ASSISTED THORACOSCOPY (VATS)/STAPLING OF APICAL BLEBS/MECHANICAL PLEURADESIS (Right)  Patient location during evaluation: PACU Anesthesia Type: General Level of consciousness: awake, awake and alert and oriented Pain management: pain level controlled Vital Signs Assessment: post-procedure vital signs reviewed and stable Respiratory status: spontaneous breathing, nonlabored ventilation and respiratory function stable Cardiovascular status: blood pressure returned to baseline Postop Assessment: no headache Anesthetic complications: no       Last Vitals:  Vitals:   11/17/16 1700 11/17/16 1800  BP:    Pulse:  84  Resp: 17 18  Temp:      Last Pain:  Vitals:   11/17/16 1624  TempSrc: Oral  PainSc: 0-No pain                 Heaton Sarin COKER

## 2016-11-17 NOTE — Progress Notes (Signed)
Pre Procedure note for inpatients:   Robin Valenzuela has been scheduled for Procedure(s): VIDEO BRONCHOSCOPY (N/A) VIDEO ASSISTED THORACOSCOPY (VATS)/STAPLING OF BLEBS (Right) today. The various methods of treatment have been discussed with the patient. After consideration of the risks, benefits and treatment options the patient has consented to the planned procedure.   The patient has been seen and labs reviewed. There are no changes in the patient's condition to prevent proceeding with the planned procedure today.  Recent labs:  Lab Results  Component Value Date   WBC 8.6 11/16/2016   HGB 14.6 11/16/2016   HCT 43.8 11/16/2016   PLT 283 11/16/2016   GLUCOSE 103 (H) 11/16/2016   ALT 22 11/16/2016   AST 28 11/16/2016   NA 138 11/16/2016   K 3.4 (L) 11/16/2016   CL 108 11/16/2016   CREATININE 0.82 11/16/2016   BUN 5 (L) 11/16/2016   CO2 21 (L) 11/16/2016   INR 1.01 11/16/2016    Delight Ovens, MD 11/17/2016 11:48 AM

## 2016-11-17 NOTE — Transfer of Care (Signed)
Immediate Anesthesia Transfer of Care Note  Patient: Robin Valenzuela  Procedure(s) Performed: Procedure(s): VIDEO BRONCHOSCOPY (N/A) VIDEO ASSISTED THORACOSCOPY (VATS)/STAPLING OF APICAL BLEBS/MECHANICAL PLEURADESIS (Right)  Patient Location: PACU  Anesthesia Type:General  Level of Consciousness: awake, alert , oriented and patient cooperative  Airway & Oxygen Therapy: Patient Spontanous Breathing and Patient connected to nasal cannula oxygen  Post-op Assessment: Report given to RN and Post -op Vital signs reviewed and stable  Post vital signs: Reviewed and stable  Last Vitals:  Vitals:   11/17/16 1220 11/17/16 1221  BP:    Pulse: 81 90  Resp: (!) 9 15  Temp:      Last Pain:  Vitals:   11/17/16 0700  TempSrc: Oral  PainSc:       Patients Stated Pain Goal: 1 (11/15/16 1130)  Complications: No apparent anesthesia complications

## 2016-11-17 NOTE — Progress Notes (Signed)
Pt ambulated in hall for a total distance of 1750 ft. With this RN. Tolerated very well, VSS throughout.

## 2016-11-17 NOTE — Anesthesia Procedure Notes (Signed)
Procedure Name: Intubation Date/Time: 11/17/2016 12:38 PM Performed by: Noreene Larsson, DAVID Pre-anesthesia Checklist: Patient identified, Emergency Drugs available, Suction available and Patient being monitored Patient Re-evaluated:Patient Re-evaluated prior to inductionOxygen Delivery Method: Circle System Utilized Preoxygenation: Pre-oxygenation with 100% oxygen Intubation Type: IV induction Ventilation: Mask ventilation without difficulty Laryngoscope Size: Miller and 2 Grade View: Grade I Tube type: Oral Endobronchial tube: Double lumen EBT and Left and 37 Fr Number of attempts: 1 Airway Equipment and Method: Stylet and Oral airway Placement Confirmation: ETT inserted through vocal cords under direct vision,  positive ETCO2 and breath sounds checked- equal and bilateral Secured at: 29 cm Tube secured with: Tape Dental Injury: Teeth and Oropharynx as per pre-operative assessment  Comments: Tube inserted by Fabio Neighbors, SRNA under supervision by MDA and CRNA

## 2016-11-17 NOTE — Anesthesia Procedure Notes (Signed)
Central Venous Catheter Insertion Performed by: Kipp Brood, anesthesiologist Start/End4/04/2017 11:30 AM, 11/17/2016 11:35 AM Preanesthetic checklist: patient identified, IV checked, site marked, risks and benefits discussed, surgical consent, monitors and equipment checked, pre-op evaluation and timeout performed Maximum sterile barriers used  and Seldinger technique used Catheter size: 8 Fr Double lumen Ultrasound Notes:anatomy identified, needle tip was noted to be adjacent to the nerve/plexus identified and no ultrasound evidence of intravascular and/or intraneural injection Attempts: 1 Following insertion, line sutured and dressing applied. Post procedure assessment: blood return through all ports, free fluid flow and no air  Patient tolerated the procedure well with no immediate complications.

## 2016-11-17 NOTE — Anesthesia Preprocedure Evaluation (Addendum)
Anesthesia Evaluation  Patient identified by MRN, date of birth, ID band Patient awake    Reviewed: Allergy & Precautions, NPO status , Patient's Chart, lab work & pertinent test results  Airway Mallampati: II  TM Distance: >3 FB Neck ROM: Full    Dental  (+) Missing, Poor Dentition, Chipped, Dental Advisory Given   Pulmonary Current Smoker,    breath sounds clear to auscultation       Cardiovascular hypertension,  Rhythm:Regular Rate:Normal     Neuro/Psych    GI/Hepatic   Endo/Other    Renal/GU      Musculoskeletal   Abdominal   Peds  Hematology   Anesthesia Other Findings   Reproductive/Obstetrics                           Anesthesia Physical Anesthesia Plan  ASA: III  Anesthesia Plan: General   Post-op Pain Management:    Induction: Intravenous  Airway Management Planned: Double Lumen EBT  Additional Equipment: CVP and Arterial line  Intra-op Plan:   Post-operative Plan: Extubation in OR  Informed Consent: I have reviewed the patients History and Physical, chart, labs and discussed the procedure including the risks, benefits and alternatives for the proposed anesthesia with the patient or authorized representative who has indicated his/her understanding and acceptance.   Dental advisory given  Plan Discussed with:   Anesthesia Plan Comments:         Anesthesia Quick Evaluation

## 2016-11-17 NOTE — Progress Notes (Signed)
TEAM 1 - Stepdown/ICU TEAM  Robin Valenzuela  ZOX:[id number] DOB: 1980/10/29 DOA: 11/14/2016 PCP: No PCP Per Patient   Brief Narrative:  36 year old female with past medical history of spontaneous pneumothorax, hypertension, depression, anxiety, tobacco use who came to the ER with the complaints of shortness of breath at [facility]. She was transferred to [facility] for further care for chest tube management and TCTS consultation.    Assessment & Plan:  Recurrent spontaneous pneumothorax Taken to OR per TCTS today for VATS - post-op care to be directed by TCTS  Hypertension Follow trend w/o change   Depression Continue Zoloft  Tobacco use nicotine patch    DVT prophylaxis: SCDs Code Status: Full Family Communication:   Disposition Plan:   Consultants:  TCTS  Procedures:   Chest tube placed on 11/14/2016  VATS 4/9  Antimicrobials:   None   Subjective: Pt not visited by [facility] today.  She has been under the care of TCTS in OR today.     Pulse: 84 68    Resp: 19 19    Temp: 98.2 F (36.8 C) 98.4 F (36.9 C) 98.5 F (36.9 C)   TempSrc: Oral Oral Oral   SpO2: 97% 98% 95% 99%  Weight:      Height:      Patient states  Intake/Output Summary (Last 24 hours) at 11/15/16 1256 Last data filed at 11/15/16 1100  Gross per 24 hour  Intake          1151.25 ml  Output              508 ml  Net           643.25 ml   Filed Weights   11/14/16 2114  Weight: 85.1 kg (187 lb 9.8 oz)    Examination:   Data Reviewed:   CBC:  Recent Labs Lab 11/14/16 2320 11/15/16 0314  WBC 5.9 6.5  NEUTROABS 3.4  --   HGB 12.2 14.3  HCT 38.3 44.3  MCV 84.9 85.7  PLT 236 299   Basic Metabolic Panel:  Recent Labs Lab 11/14/16 2320 11/15/16 0314  NA 137 138  K 3.5 3.6  CL 106 105  CO2 20* 23  GLUCOSE 100* 87  BUN 5* 5*  CREATININE 0.78 0.81  CALCIUM 8.8* 9.1    Recent Results (from the past 240 hour(s))  MRSA PCR Screening     Status: None   Collection Time:  11/14/16  9:16 PM  Result Value Ref Range Status   MRSA by PCR NEGATIVE NEGATIVE Final    Comment:        The GeneXpert MRSA Assay (FDA approved for NASAL specimens only), is one component of a comprehensive MRSA colonization surveillance program. It is not intended to diagnose MRSA infection nor to guide or monitor treatment for MRSA infections.     Scheduled Meds: . nicotine  21 mg Transdermal Daily  . NIFEdipine  60 mg Oral Daily  . prenatal multivitamin  1 tablet Oral Q1200  . senna-docusate  1 tablet Oral BID  . sertraline  50 mg Oral QHS  . sodium chloride flush  3 mL Intravenous Q12H   Continuous Infusions: . sodium chloride 75 mL/hr at 11/15/16 0700     LOS: 1 day   Lonia Blood, MD [facility] Office  [phone] Pager - Text Page per Loretha Stapler as per below:  On-Call/Text Page:      [email or web address]      password [email or web address]  If 7PM-7AM, please contact night-coverage [email or web address] Password [email or web address] 11/17/2016, 5:03 PM

## 2016-11-17 NOTE — Progress Notes (Signed)
Patient sent to OR for VATS procedure. Provided hand off report to [facility] in short stay. Patient belongings including clothing and cell phone were sent with patient.

## 2016-11-17 NOTE — Brief Op Note (Addendum)
      [address]       [name] [address]             [phone]       11/17/2016  2:28 PM  PATIENT:  Robin Valenzuela  36 y.o. female  PRE-OPERATIVE DIAGNOSIS:  recurrent collapsed lung  POST-OPERATIVE DIAGNOSIS:  recurrent collapsed lung  PROCEDURE:  Procedure(s): VIDEO BRONCHOSCOPY (N/A) VIDEO ASSISTED THORACOSCOPY (VATS)/STAPLING OF APICAL BLEBS/MECHANICAL PLEURADESIS (Right)  SURGEON:  Surgeon(s) and Role:    * Delight Ovens, MD - Primary  PHYSICIAN ASSISTANT:  Jari Favre, PA-C  ANESTHESIA:   general  EBL:  Total I/O In: 1153 [I.V.:1103; IV Piggyback:50] Out: 125 [Urine:75; Blood:50]  BLOOD ADMINISTERED:none  DRAINS: one straight chest tube   LOCAL MEDICATIONS USED:  NONE  SPECIMEN:  Source of Specimen:  right upper lobe bleb  DISPOSITION OF SPECIMEN:  PATHOLOGY  COUNTS:  YES   DICTATION: .Dragon Dictation  PLAN OF CARE: Admit to inpatient   PATIENT DISPOSITION:  ICU - intubated and hemodynamically stable.   Delay start of Pharmacological VTE agent (>24hrs) due to surgical blood loss or risk of bleeding: yes

## 2016-11-17 NOTE — Progress Notes (Signed)
Patient ID: Robin Valenzuela, female   DOB: Apr 16, 1981, 36 y.o.   MRN: [id number] SICU Evening Rounds:  Hemodynamically stable   CT output low, no air leak  Alert, pain under control

## 2016-11-18 ENCOUNTER — Inpatient Hospital Stay (HOSPITAL_COMMUNITY): Payer: Medicaid Other

## 2016-11-18 ENCOUNTER — Encounter (HOSPITAL_COMMUNITY): Payer: Self-pay | Admitting: Cardiothoracic Surgery

## 2016-11-18 LAB — BASIC METABOLIC PANEL
Anion gap: 8 (ref 5–15)
BUN: <5 mg/dL — ABNORMAL LOW (ref 6–20)
CALCIUM: 8.6 mg/dL — AB (ref 8.9–10.3)
CHLORIDE: 106 mmol/L (ref 101–111)
CO2: 24 mmol/L (ref 22–32)
Creatinine, Ser: 0.76 mg/dL (ref 0.44–1.00)
GFR calc Af Amer: >60 mL/min (ref >60)
GLUCOSE: 100 mg/dL — AB (ref 65–99)
Potassium: 3.2 mmol/L — ABNORMAL LOW (ref 3.5–5.1)
SODIUM: 138 mmol/L (ref 135–145)

## 2016-11-18 LAB — GLUCOSE, CAPILLARY
GLUCOSE-CAPILLARY: 107 mg/dL — AB (ref 65–99)
Glucose-Capillary: 99 mg/dL (ref 65–99)
Glucose-Capillary: 81 mg/dL (ref 65–99)
Glucose-Capillary: 79 mg/dL (ref 65–99)

## 2016-11-18 LAB — BLOOD GAS, ARTERIAL
Acid-base deficit: 1.5 mmol/L (ref 0.0–2.0)
BICARBONATE: 22.3 mmol/L (ref 20.0–28.0)
DRAWN BY: 441351
FIO2: 21
O2 Saturation: 95.1 %
PH ART: 7.425 (ref 7.350–7.450)
PO2 ART: 75.9 mmHg — AB (ref 83.0–108.0)
Patient temperature: 98.6
pCO2 arterial: 34.6 mmHg (ref 32.0–48.0)

## 2016-11-18 LAB — CBC
HCT: 38.7 % (ref 36.0–46.0)
Hemoglobin: 12.6 g/dL (ref 12.0–15.0)
MCH: 27.6 pg (ref 26.0–34.0)
MCHC: 32.6 g/dL (ref 30.0–36.0)
MCV: 84.9 fL (ref 78.0–100.0)
Platelets: 250 10*3/uL (ref 150–400)
RBC: 4.56 MIL/uL (ref 3.87–5.11)
RDW: 14 % (ref 11.5–15.5)
WBC: 7.4 10*3/uL (ref 4.0–10.5)

## 2016-11-18 MED ORDER — SODIUM CHLORIDE 0.9% FLUSH
3.0000 mL | Freq: Two times a day (BID) | INTRAVENOUS | Status: DC
  Administered 2016-11-18: 3 mL via INTRAVENOUS

## 2016-11-18 MED ORDER — SODIUM CHLORIDE 0.9 % IV SOLN: 250.0000 mL | INTRAVENOUS | Status: DC | PRN

## 2016-11-18 MED ORDER — SODIUM CHLORIDE 0.9% FLUSH: 3.0000 mL | INTRAVENOUS | Status: DC | PRN

## 2016-11-18 MED ORDER — MOVING RIGHT ALONG BOOK
Freq: Once | Status: AC
  Administered 2016-11-18: 11:00:00
  Filled 2016-11-18: qty 1

## 2016-11-18 MED ORDER — LEVALBUTEROL HCL 0.63 MG/3ML IN NEBU
0.6300 mg | INHALATION_SOLUTION | Freq: Four times a day (QID) | RESPIRATORY_TRACT | Status: DC | PRN
Start: 2016-11-18 — End: 2016-11-19

## 2016-11-18 MED ORDER — NICOTINE 7 MG/24HR TD PT24
7.0000 mg | MEDICATED_PATCH | Freq: Every day | TRANSDERMAL | Status: DC
  Administered 2016-11-18 – 2016-11-20 (×3): 7 mg via TRANSDERMAL
  Filled 2016-11-18 (×4): qty 1

## 2016-11-18 MED ORDER — INSULIN ASPART 100 UNIT/ML ~~LOC~~ SOLN: 0.0000 [IU] | Freq: Three times a day (TID) | SUBCUTANEOUS | Status: DC

## 2016-11-18 MED ORDER — ENOXAPARIN SODIUM 40 MG/0.4ML ~~LOC~~ SOLN
40.0000 mg | SUBCUTANEOUS | Status: DC
  Administered 2016-11-18 – 2016-11-19 (×2): 40 mg via SUBCUTANEOUS
  Filled 2016-11-18 (×2): qty 0.4

## 2016-11-18 NOTE — Progress Notes (Addendum)
[facility] DAILY ICU PROGRESS NOTE                   [address]            [name] [address]          [phone]   1 Day Post-Op Procedure(s) (LRB): VIDEO BRONCHOSCOPY (N/A) VIDEO ASSISTED THORACOSCOPY (VATS)/STAPLING OF APICAL BLEBS/MECHANICAL PLEURADESIS (Right)  Total Length of Stay:  LOS: 4 days   Subjective: Feels good this morning. Asking for nicotine patch and for a regular diet  Objective: Vital signs in last 24 hours: Temp:  [97.9 F (36.6 C)-98.6 F (37 C)] 98 F (36.7 C) (04/10 0832) Pulse Rate:  [66-100] 86 (04/10 0900) Cardiac Rhythm: Normal sinus rhythm (04/10 0800) Resp:  [9-24] 21 (04/10 0900) BP: (135-171)/(82-155) 137/105 (04/10 0900) SpO2:  [93 %-100 %] 97 % (04/10 0900) Arterial Line BP: (151-158)/(94-98) 152/98 (04/09 1505) Weight:  [82.6 kg (182 lb 1.6 oz)] 82.6 kg (182 lb 1.6 oz) (04/10 0600)  Filed Weights   11/14/16 2114 11/18/16 0600  Weight: 85.1 kg (187 lb 9.8 oz) 82.6 kg (182 lb 1.6 oz)    Weight change:      Intake/Output from previous day: 04/09 0701 - 04/10 0700 In: 2971.3 [P.O.:120; I.V.:2751.3; IV Piggyback:100] Out: 2860 [Urine:2320; Blood:50; Chest Tube:490]  Intake/Output this shift: Total I/O In: 250 [I.V.:200; IV Piggyback:50] Out: 160 [Urine:160]  Current Meds: Scheduled Meds: . acetaminophen  1,000 mg Oral Q6H   Or  . acetaminophen (TYLENOL) oral liquid 160 mg/5 mL  1,000 mg Oral Q6H  . bisacodyl  10 mg Oral Daily  . buPROPion  150 mg Oral Daily  . cefUROXime (ZINACEF)  IV  1.5 g Intravenous Q12H  . fentaNYL   Intravenous Q4H  . insulin aspart  0-24 Units Subcutaneous Q4H  . levalbuterol  0.63 mg Nebulization TID  . PARoxetine  40 mg Oral Daily  . senna-docusate  1 tablet Oral QHS   Continuous Infusions: . dextrose 5 % and 0.45% NaCl 100 mL/hr at 11/18/16 0900   PRN Meds:.diphenhydrAMINE **OR** diphenhydrAMINE, fentaNYL (SUBLIMAZE) injection, ipratropium-albuterol, naloxone **AND** sodium  chloride flush, ondansetron (ZOFRAN) IV, oxyCODONE, potassium chloride (KCL MULTIRUN) 30 mEq in 265 mL IVPB, traMADol  General appearance: alert, cooperative and no distress Heart: regular rate and rhythm, S1, S2 normal, no murmur, click, rub or gallop Lungs: clear to auscultation bilaterally Abdomen: soft, non-tender; bowel sounds normal; no masses,  no organomegaly Extremities: extremities normal, atraumatic, no cyanosis or edema Wound: clean and dry. Chest tube sutured in but at the 10 marker  Lab Results: CBC: Recent Labs  11/17/16 1615 11/18/16 0328  WBC 15.6* 7.4  HGB 13.9 12.6  HCT 42.1 38.7  PLT 291 250   BMET:  Recent Labs  11/17/16 1615 11/18/16 0328  NA 137 138  K 3.9 3.2*  CL 105 106  CO2 22 24  GLUCOSE 96 100*  BUN 5* <5*  CREATININE 0.82 0.76  CALCIUM 8.7* 8.6*    CMET: Lab Results  Component Value Date   WBC 7.4 11/18/2016   HGB 12.6 11/18/2016   HCT 38.7 11/18/2016   PLT 250 11/18/2016   GLUCOSE 100 (H) 11/18/2016   ALT 22 11/16/2016   AST 28 11/16/2016   NA 138 11/18/2016   K 3.2 (L) 11/18/2016   CL 106 11/18/2016   CREATININE 0.76 11/18/2016   BUN <5 (L) 11/18/2016   CO2 24 11/18/2016   INR 1.01 11/16/2016  PT/INR:  Recent Labs  11/16/16 1411  LABPROT 13.3  INR 1.01   Radiology: Dg Chest Port 1 View  Result Date: 11/18/2016 CLINICAL DATA:  36 year old female presented with spontaneous right side pneumothorax on 11/12/2016. Chest tube placed. EXAM: PORTABLE CHEST 1 VIEW COMPARISON:  11/17/2016 and earlier. FINDINGS: Portable AP upright view at 0623 hours. Right chest tube re- demonstrated, the side hole appears to be at the level of the chest wall external to the pleural space (arrow). Small residual right apical pneumothorax is stable since yesterday. Small volume right chest wall subcutaneous gas is stable or decreased. Stable right IJ central line. Mediastinal contours remain normal. No pulmonary edema, pleural effusion or  confluent pulmonary opacity. IMPRESSION: 1. Right chest tube in place but side hole is at the level of the chest wall, external to the pleural space. 2. Stable small residual right apical pneumothorax. 3. No new cardiopulmonary abnormality. Electronically Signed   By: Odessa Fleming M.D.   On: 11/18/2016 08:03   Dg Chest Port 1 View  Result Date: 11/17/2016 CLINICAL DATA:  Status post lung surgery, follow-up exam EXAM: PORTABLE CHEST 1 VIEW COMPARISON:  11/16/2016 FINDINGS: There is a right-sided chest tube in unchanged position. There is a small right apical pneumothorax measuring less than 10%. There is a small amount of soft tissue emphysema in the right lateral chest wall. There is no focal consolidation. There is no pleural effusion. There is no left pneumothorax. The heart and mediastinal contours are unremarkable. There is a right jugular central venous catheter with the tip projecting over the SVC. The osseous structures are unremarkable. IMPRESSION: 1. Right-sided chest tube with a persistent small right apical pneumothorax measuring less than 10%. Electronically Signed   By: Elige Ko   On: 11/17/2016 15:09     Assessment/Plan: S/P Procedure(s) (LRB): VIDEO BRONCHOSCOPY (N/A) VIDEO ASSISTED THORACOSCOPY (VATS)/STAPLING OF APICAL BLEBS/MECHANICAL PLEURADESIS (Right)  1. CV-NSR in the 80s. Good blood pressure, although diastolic high at times. 2. Pulm-has been on room air with good oxygen saturation. Satble small residual apical pneumothorax, right chest tube in place, external to the pleural space. Chest tube put out 490 in 24 hours, however no drainage charted today 3. Renal-0.76, replace electrolytes. Making good urine. Foley catheter can be removed.  4. Will reorder nicotine patch.  5. Cut back fluids to 50cc/ hour and off completely on transfer.  6. DVT proph-Lovenox  subq daily  Plan: Chest tube to waterseal and will order another CXR for the morning. It does appear that the chest  tube has been pulled out some. Transfer to 4 east.      Sharlene Dory 11/18/2016 9:52 AM     chest tube to water seal, likely remove tomorrow D/c PCA pump I have seen and examined [name] and agree with the above assessment  and plan.  Delight Ovens MD Beeper [phone] Office [phone] 11/18/2016 1:42 PM

## 2016-11-18 NOTE — Progress Notes (Signed)
11/18/2016 1130 Received pt to room 2W09 from 2H.  Tele monitor applied and CCMD notified.  Oriented to room, call light and bed.  Call bell in reach. Kathryne Hitch

## 2016-11-18 NOTE — Progress Notes (Signed)
Patient is on CT surgery service. Not seen by [facility] today.

## 2016-11-18 NOTE — Op Note (Signed)
NAMEMarland Kitchen  Robin Valenzuela, Robin Valenzuela                ACCOUNT NO.:  [id number]  MEDICAL RECORD NO.:  [id number]  LOCATION:  [id number]                        FACILITY:  [facility]  PHYSICIAN:  Sheliah Plane, MD    DATE OF BIRTH:  [birth year]  DATE OF PROCEDURE:  11/17/2016 DATE OF DISCHARGE:                              OPERATIVE REPORT   PREOPERATIVE DIAGNOSIS:  Recurrent spontaneous pneumothorax on the right.  POSTOPERATIVE DIAGNOSIS:  Recurrent spontaneous pneumothorax on the right.  SURGICAL PROCEDURE:  Bronchoscopy, right video-assisted thoracoscopy, stapling of apical blebs, and mechanical pleurodesis.  SURGEON:  Sheliah Plane, MD.  FIRST ASSISTANT:  Jari Favre, [place].  BRIEF HISTORY:  The patient is a 36 year old female with a long and current history of smoking, who presented to [facility] with a right pneumothorax several days prior to surgery.  A right chest tube was placed in the emergency room in [place] and the patient was admitted to [facility].  Initially, she her lung reinflated and was noted to have an air leak.  Ultimately, was transferred to [facility] and on examination, had mechanical problem with chest tube connections causing increased air leak.  When this was corrected, the patient's air leak stopped.  The lung was reinflated.  We obtained a CT scan of the chest that showed moderate-sized apical blebs in both lungs, right slightly greater than the left.  Because of the patient's recurrent pneumothorax on the right, two episodes in 8 months, right video-assisted thoracoscopy and stapling of apical bleb was recommended to the patient, agreed, and signed informed consent.  DESCRIPTION OF PROCEDURE:  The patient underwent general endotracheal anesthesia without incident.  A double-lumen endotracheal tube was placed with small fiberoptic bronchoscope and after appropriate time-out was performed, bronchoscopy was performed to the subsegmental level.  A double-lumen  endotracheal tube was in good position.  No endobronchial lesions were noted.  The patient did have some secretions, which were suctioned.  The patient was then turned in lateral decubitus position with the right side up.  The right chest tube was removed.  The right chest was prepped with Betadine and draped in sterile manner.  A second time-out was performed.  The mark on the right side was placed preoperatively and was confirmed.  We then proceeded with a small port incision in the midaxillary line at sixth intercostal space.  A 30- degree video scope was introduced into the chest.  This gave good examination of the pleural surfaces.  There was a 2-3 cm apical bleb easily identified.  The patient did have emphysematous changes throughout the lungs especially in the right upper lobe.  No other blebs were appreciated.  We then placed a second and a third port, one superiorly and anterior and one more posteriorly, and through these 2 ports, we were able to manipulate the lung and with 2 firings of the 45 powered stapler, were able to resect the apical bleb, which was brought out through the port site and submitted to Pathology for permanent section.  Mechanical pleurodesis was then performed.  A single chest tube was left in place through the middle port.  The anterior and posterior ports were closed with interrupted sutures in the  deep layer and a 3-0 subcuticular stitch.  Dermabond was applied.  The previous chest tube site was a relatively long incision and was closed loosely with 2 nylon sutures.  With the completion of the procedure, the patient's lung inflated nicely.  There was no air leak from the chest tube.  She was awakened and extubated in the operating room, then transferred to the recovery room for further postoperative care having tolerated the procedure without obvious complication and with minimal blood loss, less than 100 mL. Sponge and needle count was reported as  correct after completion of procedure.  The patient tolerated the procedure without obvious complication.     Sheliah Plane, MD     EG/MEDQ  D:  11/18/2016  T:  11/18/2016  Job:  [id number]

## 2016-11-18 NOTE — Progress Notes (Signed)
Patient transferred to 2W09, patient ambulated from 2H22 to new room, patient tolerated walk well, bedside report given to receiving RN, patient belongings at bedside, patient placed on tele, call bell in reach, chest tube to water seal-no air leak. No questions from receiving RN or patient.  Hermina Barters, RN

## 2016-11-18 NOTE — Progress Notes (Signed)
CARDIAC REHAB PHASE I   PRE:  Rate/Rhythm: 91 SR    BP: sitting 155/86    SaO2: 97 RA  MODE:  Ambulation: 890 ft   POST:  Rate/Rhythm: 106 ST    BP: sitting 161/111, recheck 154/88     SaO2: 97 RA  Pt doing very well. Able to walk independently, I carried CT. She talked entire walk, no c/o SOB or pain. Return to EOB. We discussed IS and smoking cessation. She sts she is done smoking. Discussed methods of quitting and I will give her a fake cigarette. Pt can walk on her own if family/staff carry CT. [phone]   Robin Valenzuela CES, ACSM 11/18/2016 2:36 PM

## 2016-11-19 ENCOUNTER — Inpatient Hospital Stay (HOSPITAL_COMMUNITY): Payer: Medicaid Other

## 2016-11-19 LAB — GLUCOSE, CAPILLARY
GLUCOSE-CAPILLARY: 89 mg/dL (ref 65–99)
GLUCOSE-CAPILLARY: 83 mg/dL (ref 65–99)
GLUCOSE-CAPILLARY: 85 mg/dL (ref 65–99)

## 2016-11-19 LAB — CBC
HCT: 37.9 % (ref 36.0–46.0)
HEMOGLOBIN: 12.5 g/dL (ref 12.0–15.0)
MCH: 27.8 pg (ref 26.0–34.0)
MCHC: 33 g/dL (ref 30.0–36.0)
MCV: 84.2 fL (ref 78.0–100.0)
Platelets: 236 10*3/uL (ref 150–400)
RBC: 4.5 MIL/uL (ref 3.87–5.11)
RDW: 14 % (ref 11.5–15.5)
WBC: 7.1 10*3/uL (ref 4.0–10.5)

## 2016-11-19 LAB — BASIC METABOLIC PANEL
ANION GAP: 7 (ref 5–15)
BUN: 6 mg/dL (ref 6–20)
CHLORIDE: 107 mmol/L (ref 101–111)
CO2: 24 mmol/L (ref 22–32)
CREATININE: 0.82 mg/dL (ref 0.44–1.00)
Calcium: 8.9 mg/dL (ref 8.9–10.3)
GFR calc non Af Amer: >60 mL/min (ref >60)
Glucose, Bld: 96 mg/dL (ref 65–99)
Potassium: 3.6 mmol/L (ref 3.5–5.1)
SODIUM: 138 mmol/L (ref 135–145)

## 2016-11-19 MED ORDER — NIFEDIPINE ER OSMOTIC RELEASE 30 MG PO TB24
60.0000 mg | ORAL_TABLET | Freq: Every day | ORAL | Status: DC
  Administered 2016-11-19 – 2016-11-20 (×2): 60 mg via ORAL
  Filled 2016-11-19 (×2): qty 2

## 2016-11-19 MED ORDER — LEVALBUTEROL HCL 0.63 MG/3ML IN NEBU
0.6300 mg | INHALATION_SOLUTION | RESPIRATORY_TRACT | Status: DC | PRN
Start: 2016-11-19 — End: 2016-11-20

## 2016-11-19 NOTE — Progress Notes (Addendum)
[address]       [name] [address]             [phone]      2 Days Post-Op Procedure(s) (LRB): VIDEO BRONCHOSCOPY (N/A) VIDEO ASSISTED THORACOSCOPY (VATS)/STAPLING OF APICAL BLEBS/MECHANICAL PLEURADESIS (Right) Subjective: Feels well, no SOB, mildly productive cough- no sputum  Objective: Vital signs in last 24 hours: Temp:  [97.7 F (36.5 C)-98.2 F (36.8 C)] 97.7 F (36.5 C) (04/11 0500) Pulse Rate:  [69-86] 69 (04/11 0500) Cardiac Rhythm: Normal sinus rhythm (04/11 0700) Resp:  [15-21] 18 (04/11 0500) BP: (137-158)/(79-105) 158/92 (04/11 0500) SpO2:  [93 %-100 %] 99 % (04/11 0500) FiO2 (%):  [21 %] 21 % (04/10 1025) Weight:  [181 lb 11.2 oz (82.4 kg)] 181 lb 11.2 oz (82.4 kg) (04/11 0500)  Hemodynamic parameters for last 24 hours:    Intake/Output from previous day: 04/10 0701 - 04/11 0700 In: 785 [P.O.:360; I.V.:300; IV Piggyback:50] Out: 220 [Urine:160; Chest Tube:60] Intake/Output this shift: No intake/output data recorded.  General appearance: alert, cooperative and no distress Heart: regular rate and rhythm Lungs: clear to auscultation bilaterally Abdomen: benign Extremities: no edema or calf tenderness Wound: + mod drainage on dressing  Lab Results:  Recent Labs  11/18/16 0328 11/19/16 0246  WBC 7.4 7.1  HGB 12.6 12.5  HCT 38.7 37.9  PLT 250 236   BMET:  Recent Labs  11/18/16 0328 11/19/16 0246  NA 138 138  K 3.2* 3.6  CL 106 107  CO2 24 24  GLUCOSE 100* 96  BUN <5* 6  CREATININE 0.76 0.82  CALCIUM 8.6* 8.9    PT/INR:  Recent Labs  11/16/16 1411  LABPROT 13.3  INR 1.01   ABG    Component Value Date/Time   PHART 7.425 11/18/2016 0426   HCO3 22.3 11/18/2016 0426   ACIDBASEDEF 1.5 11/18/2016 0426   O2SAT 95.1 11/18/2016 0426   CBG (last 3)   Recent Labs  11/18/16 1237 11/18/16 2111 11/19/16 0456  GLUCAP 81 79 89    Meds Scheduled Meds: . acetaminophen  1,000 mg Oral Q6H   Or  .  acetaminophen (TYLENOL) oral liquid 160 mg/5 mL  1,000 mg Oral Q6H  . bisacodyl  10 mg Oral Daily  . buPROPion  150 mg Oral Daily  . enoxaparin (LOVENOX) injection  40 mg Subcutaneous Q24H  . insulin aspart  0-24 Units Subcutaneous TID AC & HS  . nicotine  7 mg Transdermal Daily  . PARoxetine  40 mg Oral Daily  . senna-docusate  1 tablet Oral QHS  . sodium chloride flush  3 mL Intravenous Q12H   Continuous Infusions: . dextrose 5 % and 0.45% NaCl Stopped (11/18/16 1936)   PRN Meds:.sodium chloride, fentaNYL (SUBLIMAZE) injection, ipratropium-albuterol, levalbuterol, oxyCODONE, potassium chloride (KCL MULTIRUN) 30 mEq in 265 mL IVPB, sodium chloride flush, traMADol  Xrays Dg Chest Port 1 View  Result Date: 11/18/2016 CLINICAL DATA:  36 year old female presented with spontaneous right side pneumothorax on 11/12/2016. Chest tube placed. EXAM: PORTABLE CHEST 1 VIEW COMPARISON:  11/17/2016 and earlier. FINDINGS: Portable AP upright view at 0623 hours. Right chest tube re- demonstrated, the side hole appears to be at the level of the chest wall external to the pleural space (arrow). Small residual right apical pneumothorax is stable since yesterday. Small volume right chest wall subcutaneous gas is stable or decreased. Stable right IJ central line. Mediastinal contours remain normal. No pulmonary edema, pleural effusion  or confluent pulmonary opacity. IMPRESSION: 1. Right chest tube in place but side hole is at the level of the chest wall, external to the pleural space. 2. Stable small residual right apical pneumothorax. 3. No new cardiopulmonary abnormality. Electronically Signed   By: Odessa Fleming M.D.   On: 11/18/2016 08:03   Dg Chest Port 1 View  Result Date: 11/17/2016 CLINICAL DATA:  Status post lung surgery, follow-up exam EXAM: PORTABLE CHEST 1 VIEW COMPARISON:  11/16/2016 FINDINGS: There is a right-sided chest tube in unchanged position. There is a small right apical pneumothorax measuring less  than 10%. There is a small amount of soft tissue emphysema in the right lateral chest wall. There is no focal consolidation. There is no pleural effusion. There is no left pneumothorax. The heart and mediastinal contours are unremarkable. There is a right jugular central venous catheter with the tip projecting over the SVC. The osseous structures are unremarkable. IMPRESSION: 1. Right-sided chest tube with a persistent small right apical pneumothorax measuring less than 10%. Electronically Signed   By: Elige Ko   On: 11/17/2016 15:09    Assessment/Plan: S/P Procedure(s) (LRB): VIDEO BRONCHOSCOPY (N/A) VIDEO ASSISTED THORACOSCOPY (VATS)/STAPLING OF APICAL BLEBS/MECHANICAL PLEURADESIS (Right)  1 clinically stable, PNTX/space is unchanged- no air leak, poss d/c tube today 2 restart procardia for HTN 3 no fevers, labs stable 4 routine rehab/pulm toilet    LOS: 5 days    GOLD,WAYNE E 11/19/2016 D/c chest tube today, [poss home in am I have seen and examined [name] and agree with the above assessment  and plan.  Delight Ovens MD Beeper [phone] Office [phone] 11/19/2016 8:38 AM

## 2016-11-19 NOTE — Progress Notes (Signed)
Removed chest tube per MD order. Patient tolerated well. Suture in place and tightened. Occlusive Vaseline gauze dressing applied. Will continue to monitor.  Minerva Ends RN

## 2016-11-19 NOTE — Progress Notes (Signed)
CARDIAC REHAB PHASE I  Pt ambulating independently. Cardiac rehab will sign off at this time.   Joylene Grapes, RN, BSN 11/19/2016 12:37 PM

## 2016-11-19 NOTE — Progress Notes (Signed)
1100 Observed pt walking independently with steady gait. Will continue to follow.Luetta Nutting RN BSN 11/19/2016 11:13 AM

## 2016-11-19 NOTE — Discharge Summary (Signed)
[address]       [place] [address]             [phone]      Physician Discharge Summary  Patient ID: Robin Valenzuela MRN: [id number] DOB/AGE: 1981/02/06 35 y.o.  Admit date: 11/14/2016 Discharge date: 11/21/2016  Admission Diagnoses: Patient Active Problem List   Diagnosis Date Noted  . S/P lung surgery, follow-up exam 11/17/2016  . Pneumothorax 11/14/2016  . Constipation 11/14/2016  . Essential hypertension   . Depression   . Tobacco abuse   . Premature rupture of membranes 09/03/2015  . Trichomonas infection 07/23/2015  . Pyelonephritis affecting pregnancy 07/22/2015  . Increased nuchal translucency space on fetal ultrasound 04/13/2015  . NSVD (normal spontaneous vaginal delivery) 10/19/2012  . PROM (premature rupture of membranes) 10/19/2012    Discharge Diagnoses:  Principal Problem:   Pneumothorax Active Problems:   Essential hypertension   Depression   Tobacco abuse   Constipation   S/P lung surgery, follow-up exam   Discharged Condition: good  HPI:  Thisis a 36 y.o.femalewith medical history significant for spontaneous right pneumothorax (eight a months ago), hypertension, depression, anxiety, tobacco abuse, who presented to [facility] with complaints of chest pain and shortness of breath. She described her chest pain as fairly constant, sharp, 7 out of 10 in severity, radiating to the right shoulder blade. It is aggravated by deep breath and coughing. She denies nausea, vomiting, diarrhea, abdominal pain.  Patient states that she had pneumothorax 8 month ago. 2 days ago, she started having sudden onset of chest pain and shortness rest. She was admitted to [facility]. She was found to haverecurrent right-sided spontaneous tension pneumothorax. Chest tube was placed. Hersymptoms improved significantly. Pt was found to have fairly large air leak from chest tube; therefore, patient was transferred to [facility] for further evaluation by Dr. Tyrone Sage.  She currently is in no acute distress, her oxygenation is in the high 90's on 1 liter of oxygen via [place].  There has been no chest x ray done today;however, last evening's showed a tiny right apical pneumothorax, elevated left hemidiaphragm, and left base atelectasis.   Of note, chest tube had air leak earlier this am, but nurse found chest tube was disconnected from Pleura Vac still wrapped in tape . Now there is no air leak on -30cm suction   Hospital Course:  This is a 36 year old female who underwent a Bronchoscopy, right video-assisted thoracoscopy, stapling of apical blebs, and mechanical pleurodesis on 11/17/2016. She tolerated the procedure well and was transferred to the ICU. She was extubated in a timely manner. She remained hemodynamically stable. POD 1 she continued to improve. We put her chest tube to water seal and her drainage continued to trend down. She had a stable apical pneumothorax. We discontinued her PCA pump and she was transferred to the step down unit. We discussed smoking cessation and the patient agreed this would be most beneficial. We discontinued her chest tube POD 2 and she was independently walking. Today she is walking with limited assistance, her pain is well controlled, her chest xray remained stable and she is ready for discharge.     Consults: internal medicine  Significant Diagnostic Studies:  CLINICAL DATA:  Right-sided pneumothorax  EXAM: PORTABLE CHEST 1 VIEW  COMPARISON:  November 18, 2016  FINDINGS: Central catheter is been removed. Chest tube remains on the right with the side port at the level of  the lateral right pleural surface. Small apical pneumothorax on the left does not appear appreciably changed. No tension component.  No edema or consolidation. Heart size and pulmonary vascularity are normal. No adenopathy. No evident bone lesions.  IMPRESSION: Chest tube unchanged in position with the  side port at the level of the pleural surface on the right laterally. Apical pneumothorax remains without appreciable change. No tension component. No edema or consolidation. Stable cardiac silhouette.   Electronically Signed   By: Bretta Bang III M.D.   On: 11/19/2016 07:57  Treatments:  NAME:  Robin Valenzuela, Robin Valenzuela                ACCOUNT NO.:  [id number]  MEDICAL RECORD NO.:  [id number]  LOCATION:  [id number]                        FACILITY:  [facility]  PHYSICIAN:  Sheliah Plane, MD    DATE OF BIRTH:  1981/04/15  DATE OF PROCEDURE:  11/17/2016 DATE OF DISCHARGE:                              OPERATIVE REPORT   PREOPERATIVE DIAGNOSIS:  Recurrent spontaneous pneumothorax on the right.  POSTOPERATIVE DIAGNOSIS:  Recurrent spontaneous pneumothorax on the right.  SURGICAL PROCEDURE:  Bronchoscopy, right video-assisted thoracoscopy, stapling of apical blebs, and mechanical pleurodesis.  SURGEON:  Sheliah Plane, MD.  FIRST ASSISTANT:  Jari Favre, [place].  Discharge Exam: Blood pressure (!) 152/90, pulse 76, temperature 98.2 F (36.8 C), temperature source Oral, resp. rate 18, height  (1.753 m), weight 82.1 kg (180 lb 14.4 oz), last menstrual period 10/28/2016, SpO2 100 %, unknown if currently breastfeeding.   General appearance: alert, cooperative and no distress Heart: regular rate and rhythm, S1, S2 normal, no murmur, click, rub or gallop Lungs: clear to auscultation bilaterally Abdomen: soft, non-tender; bowel sounds normal; no masses,  no organomegaly Extremities: extremities normal, atraumatic, no cyanosis or edema Wound: clean and dry  Disposition: 01-Home or Self Care   Allergies as of 11/20/2016   No Known Allergies     Medication List    STOP taking these medications   ibuprofen 800 MG tablet Commonly known as:  ADVIL,MOTRIN     TAKE these medications   buPROPion 150 MG 24 hr tablet Commonly known as:  WELLBUTRIN XL Take 1 tablet (150 mg total)  by mouth daily.   nicotine 7 mg/24hr patch Commonly known as:  NICODERM CQ - dosed in mg/24 hr Place 1 patch (7 mg total) onto the skin daily.   NIFEdipine 60 MG 24 hr tablet Commonly known as:  PROCARDIA-XL/ADALAT CC Take 1 tablet (60 mg total) by mouth daily.   oxyCODONE 5 MG immediate release tablet Commonly known as:  Oxy IR/ROXICODONE Take 1 tablet (5 mg total) by mouth every 6 (six) hours as needed for severe pain.   PARoxetine 40 MG tablet Commonly known as:  PAXIL Take 1 tablet (40 mg total) by mouth daily.   prenatal multivitamin Tabs tablet Take 1 tablet by mouth daily at 12 noon.      Follow-up Information    Delight Ovens, MD Follow up.   Specialty:  Cardiothoracic Surgery Why:  Your appointment is on 12/08/2016 at 1:00pm. Please arrive at 12:30pm for a chest xray at [facility] which is located on the first floor of our building.  Contact information: [address]  [address] [place] [address] [phone]           Signed: Sharlene Dory 11/21/2016, 11:23 AM

## 2016-11-19 NOTE — Progress Notes (Signed)
[place] TEAM 1 - Stepdown/ICU TEAM  BRIDGETT HATTABAUGH  ZOX:[id number] DOB: 02-Oct-1980 DOA: 11/14/2016 PCP: No PCP Per Patient   Brief Narrative:  36 year old female with history of spontaneous pneumothorax in March 2018, HTN, depression, anxiety, and tobacco use who presented to the [facility] ER with shortness of breath. She was admitted at [facility], and a chest tube was placed.  After she developed a significant air leak from her chest tube she was transferred to [facility] for TCTS consultation.    Subjective: The pt is resting comfortably.  She is anxious to get up and get moving.  She reports that her chest wall pain is reasonably well controlled.  She denies abdom pain, and reports that she is having regular bowel movements.    Assessment & Plan:  Recurrent spontaneous pneumothorax Taken to OR per TCTS 11/17/16 for VATS - post-op care being directed by TCTS  Hypertension BP trending up - resume home medical tx and follow trend   Depression Cont home medical regimen  Tobacco use nicotine patch    DVT prophylaxis: lovenox  Code Status: Full Family Communication:   Disposition Plan: per TCTS (now primary service)  Procedures:   Chest tube placed on 04/06 at [facility]   VATS 4/9  Antimicrobials:  Cefuroxime 4/9 > 4/10   Pulse: 84 68    Resp: 19 19    Temp: 98.2 F (36.8 C) 98.4 F (36.9 C) 98.5 F (36.9 C)   TempSrc: Oral Oral Oral   SpO2: 97% 98% 95% 99%  Weight:      Height:      Patient states  Intake/Output Summary (Last 24 hours) at 11/15/16 1256 Last data filed at 11/15/16 1100  Gross per 24 hour  Intake          1151.25 ml  Output              508 ml  Net           643.25 ml   Filed Weights   11/14/16 2114  Weight: 85.1 kg (187 lb 9.8 oz)    Examination: General: No acute respiratory distress Lungs: Clear to auscultation bilaterally without wheezes or crackles Cardiovascular: Regular rate and rhythm without murmur  Abdomen: Nontender,  nondistended, soft, bowel sounds positive Extremities: No significant edema bilateral lower extremities   Data Reviewed:   CBC:  Recent Labs Lab 11/14/16 2320 11/15/16 0314  WBC 5.9 6.5  NEUTROABS 3.4  --   HGB 12.2 14.3  HCT 38.3 44.3  MCV 84.9 85.7  PLT 236 299   Basic Metabolic Panel:  Recent Labs Lab 11/14/16 2320 11/15/16 0314  NA 137 138  K 3.5 3.6  CL 106 105  CO2 20* 23  GLUCOSE 100* 87  BUN 5* 5*  CREATININE 0.78 0.81  CALCIUM 8.8* 9.1    Recent Results (from the past 240 hour(s))  MRSA PCR Screening     Status: None   Collection Time: 11/14/16  9:16 PM  Result Value Ref Range Status   MRSA by PCR NEGATIVE NEGATIVE Final    Comment:        The GeneXpert MRSA Assay (FDA approved for NASAL specimens only), is one component of a comprehensive MRSA colonization surveillance program. It is not intended to diagnose MRSA infection nor to guide or monitor treatment for MRSA infections.     Scheduled Meds: . nicotine  21 mg Transdermal Daily  . NIFEdipine  60 mg Oral Daily  . prenatal  multivitamin  1 tablet Oral Q1200  . senna-docusate  1 tablet Oral BID  . sertraline  50 mg Oral QHS  . sodium chloride flush  3 mL Intravenous Q12H     LOS: 1 day   Lonia Blood, MD [facility] Office  [phone] Pager - Text Page per Amion as per below:  On-Call/Text Page:      [email or web address]      password [email or web address]  If 7PM-7AM, please contact night-coverage [email or web address] Password [facility] 11/19/2016, 8:47 AM

## 2016-11-19 NOTE — Discharge Instructions (Signed)

## 2016-11-20 ENCOUNTER — Inpatient Hospital Stay (HOSPITAL_COMMUNITY): Payer: Medicaid Other

## 2016-11-20 MED ORDER — BUPROPION HCL ER (XL) 150 MG PO TB24: 150.0000 mg | ORAL_TABLET | Freq: Every day | ORAL | 0 refills | Status: DC

## 2016-11-20 MED ORDER — NICOTINE 7 MG/24HR TD PT24: 7.0000 mg | MEDICATED_PATCH | Freq: Every day | TRANSDERMAL | 0 refills | Status: DC

## 2016-11-20 MED ORDER — OXYCODONE HCL 5 MG PO TABS: 5.0000 mg | ORAL_TABLET | Freq: Four times a day (QID) | ORAL | 0 refills | Status: DC | PRN

## 2016-11-20 MED ORDER — PAROXETINE HCL 40 MG PO TABS: 40.0000 mg | ORAL_TABLET | Freq: Every day | ORAL | 0 refills | Status: DC

## 2016-11-20 NOTE — Progress Notes (Signed)
      [address]       [name] [address]             [phone]      3 Days Post-Op Procedure(s) (LRB): VIDEO BRONCHOSCOPY (N/A) VIDEO ASSISTED THORACOSCOPY (VATS)/STAPLING OF APICAL BLEBS/MECHANICAL PLEURADESIS (Right) Subjective: No issues overnight. Feeling good this morning  Objective: Vital signs in last 24 hours: Temp:  [98.1 F (36.7 C)-98.2 F (36.8 C)] 98.2 F (36.8 C) (04/12 0420) Pulse Rate:  [76-87] 84 (04/12 0420) Cardiac Rhythm: Normal sinus rhythm (04/11 1948) Resp:  [18-19] 18 (04/12 0420) BP: (135-151)/(81-96) 143/81 (04/12 0420) SpO2:  [100 %] 100 % (04/12 0420) Weight:  [82.1 kg (180 lb 14.4 oz)] 82.1 kg (180 lb 14.4 oz) (04/12 0422)     Intake/Output from previous day: 04/11 0701 - 04/12 0700 In: 1105 [P.O.:840; IV Piggyback:265] Out: 11 [Urine:1; Chest Tube:10] Intake/Output this shift: No intake/output data recorded.  General appearance: alert, cooperative and no distress Heart: regular rate and rhythm, S1, S2 normal, no murmur, click, rub or gallop Lungs: clear to auscultation bilaterally Abdomen: soft, non-tender; bowel sounds normal; no masses,  no organomegaly Extremities: extremities normal, atraumatic, no cyanosis or edema Wound: clean and dry  Lab Results:  Recent Labs  11/18/16 0328 11/19/16 0246  WBC 7.4 7.1  HGB 12.6 12.5  HCT 38.7 37.9  PLT 250 236   BMET:  Recent Labs  11/18/16 0328 11/19/16 0246  NA 138 138  K 3.2* 3.6  CL 106 107  CO2 24 24  GLUCOSE 100* 96  BUN <5* 6  CREATININE 0.76 0.82  CALCIUM 8.6* 8.9    PT/INR: No results for input(s): LABPROT, INR in the last 72 hours. ABG    Component Value Date/Time   PHART 7.425 11/18/2016 0426   HCO3 22.3 11/18/2016 0426   ACIDBASEDEF 1.5 11/18/2016 0426   O2SAT 95.1 11/18/2016 0426   CBG (last 3)   Recent Labs  11/19/16 0456 11/19/16 1138 11/19/16 2120  GLUCAP 89 83 85    Assessment/Plan: S/P Procedure(s) (LRB): VIDEO  BRONCHOSCOPY (N/A) VIDEO ASSISTED THORACOSCOPY (VATS)/STAPLING OF APICAL BLEBS/MECHANICAL PLEURADESIS (Right)  1. Chest tube removed yesterday. Tolerating room air without issues.  2. Creatinine 0.82, electrolytes okay 3. Hypertension-on home medication 4. Blood glucose level well controlled. 5. Smoking cessation discussed   Plan: Likely home today.  Await CXR this morning.    LOS: 6 days    Sharlene Dory 11/20/2016

## 2016-11-20 NOTE — Progress Notes (Signed)
Pt has been discharged home with family. IV and telemetry box removed. Pt received discharge instructions and all questions were answered. Pt left with all of her belongings. Pt left the unit via wheelchair and was accompanied by a Ferne Coe.   Berdine Dance BSN, RN

## 2016-11-20 NOTE — Care Management Note (Signed)
Case Management Note Donn Pierini RN, BSN Unit 2W-Case Manager [phone]  Patient Details  Name: Robin Valenzuela MRN: [id number] Date of Birth: Jul 04, 1981  Subjective/Objective:  Pt admitted with Pntx- s/p VATS - tx from 2H to 2W on 11/18/16               Action/Plan: PTA Pt lived at home- independent- plan to return home- no CM need identified.   Expected Discharge Date:  11/20/16               Expected Discharge Plan:  Home/Self Care  In-House Referral:     Discharge planning Services  CM Consult  Post Acute Care Choice:    Choice offered to:  NA  DME Arranged:    DME Agency:     HH Arranged:    HH Agency:     Status of Service:  Completed, signed off  If discussed at Long Length of Stay Meetings, dates discussed:    Discharge Disposition: home/self care   Additional Comments:  Darrold Span, RN 11/20/2016, 10:52 AM

## 2016-11-20 NOTE — Progress Notes (Signed)
[place] TEAM 1 - Stepdown/ICU TEAM  Robin Valenzuela  WUJ:[id number] DOB: [birth year] DOA: 11/14/2016 PCP: No PCP Per Patient   Brief Narrative:  36 year old female with history of spontaneous pneumothorax in March 2018, HTN, depression, anxiety, and tobacco use who presented to the [facility] ER with shortness of breath. She was admitted at [facility], and a chest tube was placed.  After she developed a significant air leak from her chest tube she was transferred to [facility] for TCTS consultation.    Subjective: No complaints. Doing well.    Assessment & Plan:  Recurrent spontaneous pneumothorax Taken to OR per TCTS 11/17/16 for VATS - post-op care being directed by TCTS  Hypertension BP stable on Nifedipine  Depression Cont home medical regimen  Tobacco use nicotine patch    DVT prophylaxis: lovenox  Code Status: Full Family Communication:   Disposition Plan: per TCTS  Procedures:   Chest tube placed on 04/06 at [facility]   VATS 4/9  Antimicrobials:  Cefuroxime 4/9 > 4/10   Pulse: 84 68    Resp: 19 19    Temp: 98.2 F (36.8 C) 98.4 F (36.9 C) 98.5 F (36.9 C)   TempSrc: Oral Oral Oral   SpO2: 97% 98% 95% 99%  Weight:      Height:      Patient states  Intake/Output Summary (Last 24 hours) at 11/15/16 1256 Last data filed at 11/15/16 1100  Gross per 24 hour  Intake          1151.25 ml  Output              508 ml  Net           643.25 ml   Filed Weights   11/14/16 2114  Weight: 85.1 kg (187 lb 9.8 oz)    Examination: General: No acute respiratory distress Lungs: Clear to auscultation bilaterally without wheezes or crackles. Right chest wall dressing in place from vats - no signs if infection, bleeding.  Cardiovascular: Regular rate and rhythm without murmur  Abdomen: Nontender, nondistended, soft, bowel sounds positive Extremities: No significant edema bilateral lower extremities   Data Reviewed:   CBC:  Recent Labs Lab 11/14/16 2320  11/15/16 0314  WBC 5.9 6.5  NEUTROABS 3.4  --   HGB 12.2 14.3  HCT 38.3 44.3  MCV 84.9 85.7  PLT 236 299   Basic Metabolic Panel:  Recent Labs Lab 11/14/16 2320 11/15/16 0314  NA 137 138  K 3.5 3.6  CL 106 105  CO2 20* 23  GLUCOSE 100* 87  BUN 5* 5*  CREATININE 0.78 0.81  CALCIUM 8.8* 9.1    Recent Results (from the past 240 hour(s))  MRSA PCR Screening     Status: None   Collection Time: 11/14/16  9:16 PM  Result Value Ref Range Status   MRSA by PCR NEGATIVE NEGATIVE Final    Comment:        The GeneXpert MRSA Assay (FDA approved for NASAL specimens only), is one component of a comprehensive MRSA colonization surveillance program. It is not intended to diagnose MRSA infection nor to guide or monitor treatment for MRSA infections.     Scheduled Meds: . nicotine  21 mg Transdermal Daily  . NIFEdipine  60 mg Oral Daily  . prenatal multivitamin  1 tablet Oral Q1200  . senna-docusate  1 tablet Oral BID  . sertraline  50 mg Oral QHS  . sodium chloride flush  3 mL Intravenous Q12H  LOS: 1 day   Stephania Fragmin, MD [facility] Office  [phone] Pager - Text Page per Loretha Stapler as per below:  On-Call/Text Page:      [email or web address]      password [email or web address]  If 7PM-7AM, please contact night-coverage [email or web address] Password [email or web address] 11/20/2016, 10:36 AM

## 2016-11-25 ENCOUNTER — Other Ambulatory Visit (HOSPITAL_COMMUNITY): Payer: Self-pay | Admitting: Physician Assistant

## 2016-11-25 ENCOUNTER — Telehealth (HOSPITAL_COMMUNITY): Payer: Self-pay | Admitting: Physician Assistant

## 2016-11-25 ENCOUNTER — Encounter (HOSPITAL_COMMUNITY): Payer: Self-pay | Admitting: Physician Assistant

## 2016-11-25 MED ORDER — TRAMADOL HCL 50 MG PO TABS: 50.0000 mg | ORAL_TABLET | Freq: Two times a day (BID) | ORAL | 0 refills | Status: DC | PRN

## 2016-11-25 NOTE — Telephone Encounter (Signed)
      [address]       [place] [address]             [phone]    Robin Valenzuela [id number]   S/P Bronchoscopy, right video-assisted thoracoscopy, stapling of apical blebs, and mechanical pleurodesis. On 11/17/2016.  Discharged home on 11/21/2016.   Medications: Current Outpatient Prescriptions on File Prior to Visit  Medication Sig Dispense Refill  . buPROPion (WELLBUTRIN XL) 150 MG 24 hr tablet Take 1 tablet (150 mg total) by mouth daily. 30 tablet 0  . nicotine (NICODERM CQ - DOSED IN MG/24 HR) 7 mg/24hr patch Place 1 patch (7 mg total) onto the skin daily. 28 patch 0  . NIFEdipine (PROCARDIA-XL/ADALAT CC) 60 MG 24 hr tablet Take 1 tablet (60 mg total) by mouth daily. (Patient not taking: Reported on 11/15/2016) 30 tablet 3  . oxyCODONE (OXY IR/ROXICODONE) 5 MG immediate release tablet Take 1 tablet (5 mg total) by mouth every 6 (six) hours as needed for severe pain. 30 tablet 0  . PARoxetine (PAXIL) 40 MG tablet Take 1 tablet (40 mg total) by mouth daily. 30 tablet 0  . Prenatal Vit-Fe Fumarate-FA (PRENATAL MULTIVITAMIN) TABS tablet Take 1 tablet by mouth daily at 12 noon. (Patient not taking: Reported on 11/15/2016) 30 tablet 12   No current facility-administered medications on file prior to visit.     Coumadin:  INR check Yes/No  Problems/Concerns:   Assessment:  Patient is overall doing well however she shares that she is more sore today than yesterday. She has been doing more activities but shares that she has not lifted up her children like we discussed in the hospital. I am worried that she has been lifting her 20-30 lb children and that may be the culprit. She is asking for more pain medication and I shared that I could give her one refill but she needs to decrease her activity so she is not as sore.   I will leave a prescription for Ultram at the office for a family member to pick up when they can.    Follow up Appointment:      Follow-up Information     Delight Ovens, MD Follow up.   Specialty:  Cardiothoracic Surgery Why:  Your appointment is on 12/08/2016 at 1:00pm. Please arrive at 12:30pm for a chest xray at [facility] which is located on the first floor of our building.  Contact information: [address] [place] [address] [phone]    Jari Favre, PA-C

## 2016-11-28 ENCOUNTER — Ambulatory Visit (INDEPENDENT_AMBULATORY_CARE_PROVIDER_SITE_OTHER): Payer: Self-pay | Admitting: *Deleted

## 2016-11-28 ENCOUNTER — Encounter (HOSPITAL_COMMUNITY): Payer: Self-pay | Admitting: Cardiothoracic Surgery

## 2016-11-28 DIAGNOSIS — Z09 Encounter for follow-up examination after completed treatment for conditions other than malignant neoplasm: Secondary | ICD-10-CM

## 2016-11-28 DIAGNOSIS — J939 Pneumothorax, unspecified: Secondary | ICD-10-CM

## 2016-11-28 DIAGNOSIS — Z4802 Encounter for removal of sutures: Secondary | ICD-10-CM

## 2016-11-28 DIAGNOSIS — J439 Emphysema, unspecified: Secondary | ICD-10-CM

## 2016-11-28 NOTE — Addendum Note (Signed)
Addendum  created 11/28/16 1011 by Joee Iovine A Sakshi Sermons, CRNA   Anesthesia Event edited, Anesthesia Staff edited    

## 2016-11-28 NOTE — Progress Notes (Signed)
Ms. Cordoba returns s/p R VATS, STAPLING OF BLEBS on 11/17/16 for suture removal from the VATS/chest tube sites. These were easily removed and all sites are well healed. Diet and appetite are good. She is using the Tramadol. She will return as scheduled with a CXR.

## 2016-12-05 ENCOUNTER — Other Ambulatory Visit: Payer: Self-pay | Admitting: Cardiothoracic Surgery

## 2016-12-05 DIAGNOSIS — S270XXD Traumatic pneumothorax, subsequent encounter: Secondary | ICD-10-CM

## 2016-12-08 ENCOUNTER — Inpatient Hospital Stay (HOSPITAL_COMMUNITY): Payer: Medicaid Other

## 2016-12-08 ENCOUNTER — Ambulatory Visit (INDEPENDENT_AMBULATORY_CARE_PROVIDER_SITE_OTHER): Payer: Self-pay | Admitting: Physician Assistant

## 2016-12-08 ENCOUNTER — Encounter (HOSPITAL_COMMUNITY): Payer: Self-pay | Admitting: General Surgery

## 2016-12-08 ENCOUNTER — Ambulatory Visit
Admission: RE | Admit: 2016-12-08 | Discharge: 2016-12-08 | Disposition: A | Discharge status: Home / Self Care | Payer: Medicaid Other | Source: Ambulatory Visit | Attending: Cardiothoracic Surgery | Admitting: Cardiothoracic Surgery

## 2016-12-08 ENCOUNTER — Inpatient Hospital Stay (HOSPITAL_COMMUNITY)
Admission: AD | Admit: 2016-12-08 | Discharge: 2016-12-11 | DRG: 201 | Disposition: A | Discharge status: Home / Self Care | Payer: Medicaid Other | Source: Ambulatory Visit | Attending: Cardiothoracic Surgery | Admitting: Cardiothoracic Surgery

## 2016-12-08 VITALS — BP 171/108 | HR 80 | Resp 18 | Ht 69.0 in | Wt 186.0 lb

## 2016-12-08 DIAGNOSIS — F329 Major depressive disorder, single episode, unspecified: Secondary | ICD-10-CM | POA: Diagnosis present

## 2016-12-08 DIAGNOSIS — Z91048 Other nonmedicinal substance allergy status: Secondary | ICD-10-CM

## 2016-12-08 DIAGNOSIS — Z8249 Family history of ischemic heart disease and other diseases of the circulatory system: Secondary | ICD-10-CM | POA: Diagnosis not present

## 2016-12-08 DIAGNOSIS — I10 Essential (primary) hypertension: Secondary | ICD-10-CM | POA: Diagnosis present

## 2016-12-08 DIAGNOSIS — R079 Chest pain, unspecified: Secondary | ICD-10-CM | POA: Diagnosis present

## 2016-12-08 DIAGNOSIS — Z87730 Personal history of (corrected) cleft lip and palate: Secondary | ICD-10-CM

## 2016-12-08 DIAGNOSIS — M25511 Pain in right shoulder: Secondary | ICD-10-CM | POA: Diagnosis not present

## 2016-12-08 DIAGNOSIS — Z79899 Other long term (current) drug therapy: Secondary | ICD-10-CM | POA: Diagnosis not present

## 2016-12-08 DIAGNOSIS — Z09 Encounter for follow-up examination after completed treatment for conditions other than malignant neoplasm: Secondary | ICD-10-CM

## 2016-12-08 DIAGNOSIS — J9383 Other pneumothorax: Principal | ICD-10-CM

## 2016-12-08 DIAGNOSIS — F419 Anxiety disorder, unspecified: Secondary | ICD-10-CM | POA: Diagnosis present

## 2016-12-08 DIAGNOSIS — J439 Emphysema, unspecified: Secondary | ICD-10-CM

## 2016-12-08 DIAGNOSIS — J939 Pneumothorax, unspecified: Secondary | ICD-10-CM

## 2016-12-08 DIAGNOSIS — Z833 Family history of diabetes mellitus: Secondary | ICD-10-CM

## 2016-12-08 DIAGNOSIS — S270XXD Traumatic pneumothorax, subsequent encounter: Secondary | ICD-10-CM

## 2016-12-08 DIAGNOSIS — Z87891 Personal history of nicotine dependence: Secondary | ICD-10-CM | POA: Diagnosis not present

## 2016-12-08 HISTORY — DX: Chronic obstructive pulmonary disease, unspecified: J44.9

## 2016-12-08 HISTORY — PX: CHEST TUBE INSERTION: SHX231

## 2016-12-08 LAB — CBC
HCT: 40.2 % (ref 36.0–46.0)
HEMOGLOBIN: 13.3 g/dL (ref 12.0–15.0)
MCH: 27.7 pg (ref 26.0–34.0)
MCHC: 33.1 g/dL (ref 30.0–36.0)
MCV: 83.6 fL (ref 78.0–100.0)
Platelets: 364 10*3/uL (ref 150–400)
RBC: 4.81 MIL/uL (ref 3.87–5.11)
RDW: 13.7 % (ref 11.5–15.5)
WBC: 7.3 10*3/uL (ref 4.0–10.5)

## 2016-12-08 LAB — CREATININE, SERUM
Creatinine, Ser: 0.91 mg/dL (ref 0.44–1.00)
GFR calc Af Amer: >60 mL/min (ref >60)
GFR calc non Af Amer: >60 mL/min (ref >60)

## 2016-12-08 MED ORDER — OXYCODONE HCL 5 MG PO TABS
5.0000 mg | ORAL_TABLET | ORAL | Status: DC | PRN
  Administered 2016-12-08 – 2016-12-09 (×4): 5 mg via ORAL
  Administered 2016-12-10 – 2016-12-11 (×7): 10 mg via ORAL
  Filled 2016-12-08: qty 1
  Filled 2016-12-08: qty 2
  Filled 2016-12-08: qty 1
  Filled 2016-12-08 (×4): qty 2
  Filled 2016-12-08: qty 1
  Filled 2016-12-08: qty 2
  Filled 2016-12-08: qty 1
  Filled 2016-12-08: qty 2

## 2016-12-08 MED ORDER — HYDRALAZINE HCL 20 MG/ML IJ SOLN: 10.0000 mg | Freq: Four times a day (QID) | INTRAMUSCULAR | Status: DC | PRN

## 2016-12-08 MED ORDER — LIDOCAINE HCL 1 % IJ SOLN
INTRAMUSCULAR | Status: AC
  Filled 2016-12-08: qty 20

## 2016-12-08 MED ORDER — ORAL CARE MOUTH RINSE
15.0000 mL | Freq: Two times a day (BID) | OROMUCOSAL | Status: DC
  Administered 2016-12-09 – 2016-12-11 (×3): 15 mL via OROMUCOSAL

## 2016-12-08 MED ORDER — ACETAMINOPHEN 325 MG PO TABS: 650.0000 mg | ORAL_TABLET | Freq: Four times a day (QID) | ORAL | Status: DC | PRN

## 2016-12-08 MED ORDER — NIFEDIPINE ER OSMOTIC RELEASE 30 MG PO TB24
60.0000 mg | ORAL_TABLET | Freq: Every day | ORAL | Status: DC
  Administered 2016-12-09 – 2016-12-11 (×3): 60 mg via ORAL
  Filled 2016-12-08 (×3): qty 2

## 2016-12-08 MED ORDER — BUPROPION HCL ER (XL) 150 MG PO TB24
150.0000 mg | ORAL_TABLET | Freq: Every day | ORAL | Status: DC
  Administered 2016-12-09 – 2016-12-11 (×3): 150 mg via ORAL
  Filled 2016-12-08 (×5): qty 1

## 2016-12-08 MED ORDER — ENOXAPARIN SODIUM 40 MG/0.4ML ~~LOC~~ SOLN
40.0000 mg | SUBCUTANEOUS | Status: DC
  Administered 2016-12-08 – 2016-12-10 (×3): 40 mg via SUBCUTANEOUS
  Filled 2016-12-08 (×3): qty 0.4

## 2016-12-08 MED ORDER — OXYCODONE HCL 5 MG PO TABS: 5.0000 mg | ORAL_TABLET | ORAL | Status: DC | PRN

## 2016-12-08 MED ORDER — MIDAZOLAM HCL 2 MG/2ML IJ SOLN
INTRAMUSCULAR | Status: AC
  Filled 2016-12-08: qty 4

## 2016-12-08 MED ORDER — TRAMADOL HCL 50 MG PO TABS
50.0000 mg | ORAL_TABLET | Freq: Four times a day (QID) | ORAL | Status: DC | PRN
  Administered 2016-12-08 – 2016-12-09 (×4): 100 mg via ORAL
  Filled 2016-12-08 (×4): qty 2

## 2016-12-08 MED ORDER — PRENATAL MULTIVITAMIN CH
1.0000 | ORAL_TABLET | Freq: Every day | ORAL | Status: DC
  Administered 2016-12-08 – 2016-12-11 (×4): 1 via ORAL
  Filled 2016-12-08 (×6): qty 1

## 2016-12-08 MED ORDER — ACETAMINOPHEN 325 MG PO TABS
650.0000 mg | ORAL_TABLET | Freq: Four times a day (QID) | ORAL | Status: DC | PRN
  Administered 2016-12-08 – 2016-12-11 (×2): 650 mg via ORAL
  Filled 2016-12-08 (×2): qty 2

## 2016-12-08 MED ORDER — PAROXETINE HCL 20 MG PO TABS
40.0000 mg | ORAL_TABLET | Freq: Every day | ORAL | Status: DC
  Administered 2016-12-08 – 2016-12-10 (×3): 40 mg via ORAL
  Filled 2016-12-08 (×3): qty 2

## 2016-12-08 MED ORDER — ONDANSETRON HCL 4 MG/2ML IJ SOLN: 4.0000 mg | Freq: Four times a day (QID) | INTRAMUSCULAR | Status: DC | PRN

## 2016-12-08 MED ORDER — MIDAZOLAM HCL 2 MG/2ML IJ SOLN
INTRAMUSCULAR | Status: AC | PRN
  Administered 2016-12-08 (×4): 1 mg via INTRAVENOUS

## 2016-12-08 MED ORDER — TRAMADOL HCL 50 MG PO TABS: 50.0000 mg | ORAL_TABLET | Freq: Four times a day (QID) | ORAL | Status: DC | PRN

## 2016-12-08 MED ORDER — FENTANYL CITRATE (PF) 100 MCG/2ML IJ SOLN
INTRAMUSCULAR | Status: AC
  Filled 2016-12-08: qty 4

## 2016-12-08 MED ORDER — FENTANYL CITRATE (PF) 100 MCG/2ML IJ SOLN
INTRAMUSCULAR | Status: AC | PRN
  Administered 2016-12-08 (×4): 50 ug via INTRAVENOUS

## 2016-12-08 NOTE — H&P (Addendum)
[address]       [place] [address]             [phone]        Robin Valenzuela [facility] Medical Record #[id number] Date of Birth: [birth year]  Primary Care: Hal Morales, NP  Chief Complaint:  Spontaneous Pneumothorax  History of Present Illness:      Ms. Dickens is a 36 yo white female well known to TCTS.  She is S/P Right VATS with stapling of apical blebs, mechanical pleurodesis, and video bronchoscopy performed 11/17/2016.  She had an uncomplicated hospital course and was later discharged home.  She initially did okay at discharge.  She was contacted at home by our office at which time she was experiencing some increased right sided chest pain.  This was however attributed to possibly lifting her kids.  She had her sutures removed on 11/28/2016 again at which time she was experiencing some discomfort but was taking Tramadol for relief.  She presented to the office today for hospital follow up.  She states that she has had terrible right sided pain for the past 3 days.  She states that prior to all of this she could walk and hike without difficulties.  However she went fishing yesterday and struggled to walk up the hill due to her chest pain and shortness of breath.  She also states on Friday she noticed some numbness of her lips and nose, which happened when she presented with her previous pneumothorax.  She is no longer smoking and is currently utilizing nicotine patches.  She states she did not want to come today because she had a feeling her pneumothorax was back.  She is visibly upset and crying.    Past Medical History:  Diagnosis Date  . Abnormal Pap smear   . Anxiety   . Depression   . 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 Essential hypertension   . Pneumothorax   . Tobacco abuse     Past Surgical History:  Procedure Laterality Date  . CESAREAN SECTION N/A 09/06/2015   Procedure: CESAREAN SECTION;  Surgeon: Huel Cote, MD;  Location:  [facility] ORS;  Service: Obstetrics;  Laterality: N/A;  . CHOLECYSTECTOMY    . CLEFT LIP REPAIR Left   . COLPOSCOPY    . CRYOTHERAPY    . RHINOPLASTY FOR CLEFT LIP / PALATE    . VIDEO ASSISTED THORACOSCOPY (VATS)/WEDGE RESECTION Right 11/17/2016   Procedure: VIDEO ASSISTED THORACOSCOPY (VATS)/STAPLING OF APICAL BLEBS/MECHANICAL PLEURADESIS;  Surgeon: Delight Ovens, MD;  Location: [facility] OR;  Service: Thoracic;  Laterality: Right;  Marland Kitchen VIDEO BRONCHOSCOPY N/A 11/17/2016   Procedure: VIDEO BRONCHOSCOPY;  Surgeon: Delight Ovens, MD;  Location: [facility] OR;  Service: Thoracic;  Laterality: N/A;  . WISDOM TOOTH EXTRACTION      History  Smoking Status  . Former Smoker  . Packs/day: 0.50  . Years: 15.00  . Types: Cigarettes  . Quit date: 11/10/2016  Smokeless Tobacco  . Never Used    Comment: ON THE PATCH    History  Alcohol Use No    Social History   Social History  . Marital status: Single    Spouse name: N/A  . Number of children: N/A  . Years of education: N/A   Occupational History  . Not on file.   Social History Main Topics  . Smoking status: Former Smoker    Packs/day: 0.50    Years: 15.00  Types: Cigarettes    Quit date: 11/10/2016  . Smokeless tobacco: Never Used     Comment: ON THE PATCH  . Alcohol use No  . Drug use: No  . Sexual activity: Yes   Other Topics Concern  . Not on file   Social History Narrative  . No narrative on file    No Known Allergies  Current Facility-Administered Medications  Medication Dose Route Frequency Provider Last Rate Last Dose  . acetaminophen (TYLENOL) tablet 650 mg  650 mg Oral Q6H PRN Erin R Barrett, PA-C      . [START ON 12/09/2016] buPROPion (WELLBUTRIN XL) 24 hr tablet 150 mg  150 mg Oral Daily Erin R Barrett, PA-C      . hydrALAZINE (APRESOLINE) injection 10 mg  10 mg Intravenous Q6H PRN Erin R Barrett, PA-C      . [START ON 12/09/2016] NIFEdipine (PROCARDIA-XL/ADALAT-CC/NIFEDICAL-XL) 24 hr tablet 60 mg  60 mg Oral Daily Erin R  Barrett, PA-C      . ondansetron (ZOFRAN) injection 4 mg  4 mg Intravenous Q6H PRN Erin R Barrett, PA-C      . oxyCODONE (Oxy IR/ROXICODONE) immediate release tablet 5-10 mg  5-10 mg Oral Q3H PRN Erin R Barrett, PA-C      . PARoxetine (PAXIL) tablet 40 mg  40 mg Oral Daily Erin R Barrett, PA-C      . prenatal multivitamin tablet 1 tablet  1 tablet Oral Q1200 Erin R Barrett, PA-C      . traMADol (ULTRAM) tablet 50-100 mg  50-100 mg Oral Q6H PRN Erin R Barrett, PA-C        Prescriptions Prior to Admission  Medication Sig Dispense Refill Last Dose  . buPROPion (WELLBUTRIN XL) 150 MG 24 hr tablet Take 1 tablet (150 mg total) by mouth daily. 30 tablet 0 Taking  . lisinopril (PRINIVIL,ZESTRIL) 10 MG tablet Take 10 mg by mouth daily.   Taking  . nicotine (NICODERM CQ - DOSED IN MG/24 HR) 7 mg/24hr patch Place 1 patch (7 mg total) onto the skin daily. 28 patch 0 Taking  . NIFEdipine (PROCARDIA-XL/ADALAT CC) 60 MG 24 hr tablet Take 1 tablet (60 mg total) by mouth daily. (Patient not taking: Reported on 11/15/2016) 30 tablet 3 Not Taking  . PARoxetine (PAXIL) 40 MG tablet Take 1 tablet (40 mg total) by mouth daily. 30 tablet 0 Taking  . Prenatal Vit-Fe Fumarate-FA (PRENATAL MULTIVITAMIN) TABS tablet Take 1 tablet by mouth daily at 12 noon. (Patient not taking: Reported on 11/15/2016) 30 tablet 12 Not Taking  . traMADol (ULTRAM) 50 MG tablet Take 1 tablet (50 mg total) by mouth every 12 (twelve) hours as needed. 20 tablet 0 Taking    Family History  Problem Relation Age of Onset  . Diabetes Mellitus II Mother   . Hypertension Mother   . Lung cancer Father   . Hypertension Father   . Hepatitis C Father     Review of Systems:     Cardiac Review of Systems: Y or N  Chest Pain [  y  ]  Resting SOB [ y  ] Exertional SOB  [ y ]  Orthopnea [  ]   Pedal Edema [   ]    Palpitations [  ] Syncope  [n  ]   Presyncope [   ]  General Review of Systems: [Y] = yes [  ]=no Constitional: recent weight change [ n ];  anorexia [  ]; fatigue [ n ]; nausea [  ];  night sweats [  ]; fever [  ]; or chills [  ]                                                               Dental: poor dentition[y  ]; Last Dentist visit:   Eye : blurred vision [  ]; diplopia [   ]; vision changes [  ];  Amaurosis fugax[  ]; Resp: cough [ y ];  wheezing[ n ];  hemoptysis[n  ]; shortness of breath[y  ]; paroxysmal nocturnal dyspnea[  ]; dyspnea on exertion[ y ]; or orthopnea[  ];  GI:  gallstones[  ], vomiting[  ];  dysphagia[  ]; melena[  ];  hematochezia [  ]; heartburn[  ];   Hx of  Colonoscopy[  ]; GU: kidney stones [  ]; hematuria[  ];   dysuria [  ];  nocturia[  ];  history of     obstruction [  ]; urinary frequency [  ]             Skin: rash, swelling[  ];, hair loss[  ];  peripheral edema[  ];  or itching[  ]; Musculosketetal: myalgias[  ];  joint swelling[  ];  joint erythema[  ];  joint pain[  ];  back pain[  ];  Heme/Lymph: bruising[  ];  bleeding[  ];  anemia[  ];  Neuro: TIA[n  ];  headaches[n  ];  stroke[n  ];  vertigo[  ];  seizures[  ];   paresthesias[ y ];  difficulty walking[  ];  Psych:depression[  ]; anxiety[  ];  Endocrine: diabetes[  ];  thyroid dysfunction[  ];  Immunizations: Flu [  ]; Pneumococcal[  ];  Other:  Physical Exam: Ht  (1.727 m)   Wt 183 lb 3.2 oz (83.1 kg)   BMI 27.86 kg/m   General appearance: alert, cooperative, no distress and visibly upset and crying Head: Normocephalic, without obvious abnormality, atraumatic Lymph nodes: Cervical, supraclavicular, and axillary nodes normal. Resp: diminished breath sounds on the right Cardio: regular rate and rhythm GI: soft, non-tender; bowel sounds normal; no masses,  no organomegaly Extremities: extremities normal, atraumatic, no cyanosis or edema Neurologic: Grossly normal  Diagnostic Studies & Laboratory data:     Recent Radiology Findings:   Dg Chest 2 View  Addendum Date: 12/08/2016   ADDENDUM REPORT: 12/08/2016 13:03 ADDENDUM:  Critical Value/emergent results were called by telephone at the time of interpretation on 12/08/2016 at 12:47 pm to Dr. Sheliah Plane , who verbally acknowledged these results. Electronically Signed   By: Charlett Nose M.D.   On: 12/08/2016 13:03   Result Date: 12/08/2016 CLINICAL DATA:  Follow-up dramatic pneumothorax. EXAM: CHEST  2 VIEW COMPARISON:  11/20/2016 FINDINGS: Significant enlargement of the right pneumothorax, now all moderate to large over both the apex and left base, approximately 50%. Mild depression of the right hemidiaphragm. Cannot exclude early tension. Left lung is clear. Heart is normal size. IMPRESSION: Significant enlargement of the right pneumothorax, now approximately 50% with mild flattening of the right hemidiaphragm. Cannot exclude a component of tension. Attempts are being made at this time to contact Dr. Tyrone Sage with these results. Electronically Signed: By: Charlett Nose M.D. On: 12/08/2016 12:46     I have  independently reviewed the above radiologic studies.  Recent Lab Findings: Lab Results  Component Value Date   WBC 7.1 11/19/2016   HGB 12.5 11/19/2016   HCT 37.9 11/19/2016   PLT 236 11/19/2016   GLUCOSE 96 11/19/2016   ALT 22 11/16/2016   AST 28 11/16/2016   NA 138 11/19/2016   K 3.6 11/19/2016   CL 107 11/19/2016   CREATININE 0.82 11/19/2016   BUN 6 11/19/2016   CO2 24 11/19/2016   INR 1.01 11/16/2016      Assessment / Plan:    1. Recurrent Spontaneous Pneumothorax on the Right- patient is S/P VATS with resection of blebs and mechanical pleurodesis performed 4/9... She has been having dyspnea and right sided chest discomfort for past several days 2. CV- HTN- continue home Procardia 3. Depression- continue paxil, wellbutrin 4. H/O Nicotinue abuse, continue Nicotine patch 5. Pain control- Oxy IR, Tramadol 6. Dispo- admit patient to telemetry unit on 2W, will ask IR to place CT guided chest tube... Will make NPO, however patient has not eaten  since yesterday, but has had liquid intake in the past hour   I  spent 40 minutes counseling the patient face to face and 50% or more the  time was spent in counseling and coordination of care. The total time spent in the appointment was 55 minutes.    Denny Peon Barrett PA-C 12/08/2016 2:06 PM  Placed ct directed chest tube lower chest  I have seen and examined [name] and agree with the above assessment  and plan.  Delight Ovens MD Beeper [phone] Office [phone] 12/08/2016 2:06 PM

## 2016-12-08 NOTE — Progress Notes (Signed)
Patient presented for hospital follow up.  Cxr shows large pneumothorax.  She will be admitted to the hospital.  Formal H and P will be in hospital chart.

## 2016-12-08 NOTE — Consult Note (Signed)
Chief Complaint: spontaneous PTX  Referring Physician:Dr. Sheliah Plane  Supervising Physician: Oley Balm  Patient Status: [facility] - In-pt  HPI: Robin Valenzuela is a 36 y.o. female with a history of previous spontaneous pneumothoraces, on last year and one earlier this month.  She underwent a VATS with stapling of the apical blebs and mechanical pleurodesis.  She presented today to Dr. Dennie Maizes office for follow up with a CXR.  This revealed about a 50 % PTX.  She states she has been feeling "weird" the last several days but it wasn't quite the same as the last 2 times.  She is now stating to get some pain in her right shoulder blade.  Upon seeing her CXR results, the patient was directly admitted to the hospital for further management.  We have been asked to see her today for placement of a chest tube.  Past Medical History:  Past Medical History:  Diagnosis Date  . Abnormal Pap smear   . Anxiety   . Depression   . 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 Essential hypertension   . Pneumothorax   . Tobacco abuse     Past Surgical History:  Past Surgical History:  Procedure Laterality Date  . CESAREAN SECTION N/A 09/06/2015   Procedure: CESAREAN SECTION;  Surgeon: Huel Cote, MD;  Location: [facility] ORS;  Service: Obstetrics;  Laterality: N/A;  . CHOLECYSTECTOMY    . CLEFT LIP REPAIR Left   . COLPOSCOPY    . CRYOTHERAPY    . RHINOPLASTY FOR CLEFT LIP / PALATE    . VIDEO ASSISTED THORACOSCOPY (VATS)/WEDGE RESECTION Right 11/17/2016   Procedure: VIDEO ASSISTED THORACOSCOPY (VATS)/STAPLING OF APICAL BLEBS/MECHANICAL PLEURADESIS;  Surgeon: Delight Ovens, MD;  Location: [facility] OR;  Service: Thoracic;  Laterality: Right;  Marland Kitchen VIDEO BRONCHOSCOPY N/A 11/17/2016   Procedure: VIDEO BRONCHOSCOPY;  Surgeon: Delight Ovens, MD;  Location: [facility] OR;  Service: Thoracic;  Laterality: N/A;  . WISDOM TOOTH EXTRACTION      Family History:  Family History  Problem Relation Age of  Onset  . Diabetes Mellitus II Mother   . Hypertension Mother   . Lung cancer Father   . Hypertension Father   . Hepatitis C Father     Social History:  reports that she quit smoking about 4 weeks ago. Her smoking use included Cigarettes. She has a 7.50 pack-year smoking history. She has never used smokeless tobacco. She reports that she does not drink alcohol or use drugs.  Allergies:  Allergies  Allergen Reactions  . Adhesive [Tape]     Causes skin irritation, when tape is removed her skin comes off too    Medications: Medications reviewed in epic  Please HPI for pertinent positives, otherwise complete 10 system ROS negative.  Mallampati Score: MD Evaluation Airway: WNL Heart: WNL Abdomen: WNL Chest/ Lungs: Other (comments) Chest/ lungs comments: decreased breath sounds of right lung secondary to PTX ASA  Classification: 2 Mallampati/Airway Score: One  Physical Exam: BP (!) 185/106 (BP Location: Right Arm)   Pulse 61   Temp 98 F (36.7 C) (Oral)   Ht  (1.727 m)   Wt 183 lb 3.2 oz (83.1 kg)   SpO2 98%   BMI 27.86 kg/m  Body mass index is 27.86 kg/m. General: pleasant, WD, WN white female who is laying in bed in NAD HEENT: head is normocephalic, atraumatic.  Sclera are noninjected.  PERRL.  Ears and nose without any masses or lesions.  Mouth is pink  and moist Heart: regular, rate, and rhythm.  Normal s1,s2. No obvious murmurs, gallops, or rubs noted.  Palpable radial and pedal pulses bilaterally Lungs: CTA on left side, but decreased breath sounds on the right, no wheezes, rhonchi, or rales noted.  Respiratory effort nonlabored, O2 via [place] Abd: soft, NT, ND, +BS, no masses, hernias, or organomegaly Psych: A&Ox3 with an appropriate affect.   Labs: Results for orders placed or performed during the hospital encounter of 12/08/16 (from the past 48 hour(s))  CBC     Status: None   Collection Time: 12/08/16  2:07 PM  Result Value Ref Range   WBC 7.3 4.0 - 10.5  K/uL   RBC 4.81 3.87 - 5.11 MIL/uL   Hemoglobin 13.3 12.0 - 15.0 g/dL   HCT 16.1 09.6 - 04.5 %   MCV 83.6 78.0 - 100.0 fL   MCH 27.7 26.0 - 34.0 pg   MCHC 33.1 30.0 - 36.0 g/dL   RDW 40.9 81.1 - 91.4 %   Platelets 364 150 - 400 K/uL    Imaging: Dg Chest 2 View  Addendum Date: 12/08/2016   ADDENDUM REPORT: 12/08/2016 13:03 ADDENDUM: Critical Value/emergent results were called by telephone at the time of interpretation on 12/08/2016 at 12:47 pm to Dr. Sheliah Plane , who verbally acknowledged these results. Electronically Signed   By: Charlett Nose M.D.   On: 12/08/2016 13:03   Result Date: 12/08/2016 CLINICAL DATA:  Follow-up dramatic pneumothorax. EXAM: CHEST  2 VIEW COMPARISON:  11/20/2016 FINDINGS: Significant enlargement of the right pneumothorax, now all moderate to large over both the apex and left base, approximately 50%. Mild depression of the right hemidiaphragm. Cannot exclude early tension. Left lung is clear. Heart is normal size. IMPRESSION: Significant enlargement of the right pneumothorax, now approximately 50% with mild flattening of the right hemidiaphragm. Cannot exclude a component of tension. Attempts are being made at this time to contact Dr. Tyrone Sage with these results. Electronically Signed: By: Charlett Nose M.D. On: 12/08/2016 12:46    Assessment/Plan 1. Spontaneous right PTX, recurrent -we will plan to proceed today with right chest tube placement under CT guidance.   -the patient had a few sips of Pepsi at 1145am.  Dr. Deanne Coffer is comfortable proceeding with placement today. -last PT INR was on 4/9 and was 1.01.  The patient does not take any blood thinners.  Her CBC and BMET are within normal limits.   -Risks and Benefits discussed with the patient including bleeding, infection, damage to adjacent structures.  All of the patient's questions were answered, patient is agreeable to proceed. Consent signed and in chart.  Thank you for this interesting consult.  I  greatly enjoyed meeting Robin Valenzuela and look forward to participating in their care.  A copy of this report was sent to the requesting provider on this date.  Electronically Signed: Letha Cape 12/08/2016, 3:41 PM   I spent a total of 40 Minutes    in face to face in clinical consultation, greater than 50% of which was counseling/coordinating care for spontaneous right PTX

## 2016-12-08 NOTE — Sedation Documentation (Signed)
Some coughing when lung reinflated, no hemoptysis

## 2016-12-08 NOTE — Procedures (Signed)
CT 70F RIGHT chest tube placed No complication No blood loss. See complete dictation in [facility].

## 2016-12-09 ENCOUNTER — Inpatient Hospital Stay (HOSPITAL_COMMUNITY): Payer: Medicaid Other

## 2016-12-09 ENCOUNTER — Encounter (HOSPITAL_COMMUNITY): Payer: Self-pay | Admitting: General Practice

## 2016-12-09 MED ORDER — LISINOPRIL 10 MG PO TABS
10.0000 mg | ORAL_TABLET | Freq: Every day | ORAL | Status: DC
  Administered 2016-12-09 – 2016-12-11 (×3): 10 mg via ORAL
  Filled 2016-12-09 (×3): qty 1

## 2016-12-09 NOTE — Progress Notes (Addendum)
      [address]       Robin Valenzuela [address]             [phone]           Subjective: Patient states this chest tube hurts worse than last time.  Objective: Vital signs in last 24 hours: Temp:  [97.9 F (36.6 C)-98.1 F (36.7 C)] 97.9 F (36.6 C) (05/01 0414) Pulse Rate:  [60-87] 64 (05/01 0414) Cardiac Rhythm: Normal sinus rhythm (04/30 1916) Resp:  [16-24] 18 (05/01 0414) BP: (142-188)/(82-108) 155/82 (05/01 0414) SpO2:  [95 %-100 %] 98 % (05/01 0414) Weight:  [83.1 kg (183 lb 3.2 oz)-84.4 kg (186 lb)] 83.1 kg (183 lb 3.2 oz) (04/30 1357)     Intake/Output from previous day: 04/30 0701 - 05/01 0700 In: 1320 [P.O.:1320] Out: 400 [Urine:400]   Physical Exam:  Cardiovascular: RRR Pulmonary: Clear to auscultation bilaterally Chest Tube: to suction, no air leak  Lab Results: CBC: Recent Labs  12/08/16 1407  WBC 7.3  HGB 13.3  HCT 40.2  PLT 364   BMET:  Recent Labs  12/08/16 1801  CREATININE 0.91    PT/INR: No results for input(s): LABPROT, INR in the last 72 hours. ABG:  INR: Will add last result for INR, ABG once components are confirmed Will add last 4 CBG results once components are confirmed  Assessment/Plan:  1. CV - Hypertensive with SBP mostly in the 150's. On Nifedipine 60 mg daily and Hydralazine PRN. Will restart Lisinopril 10 mg daily for better BP control.  2.  Pulmonary - On room air. Chest tube is to suction and there is no air leak. Chest tube was placed in the early evening last night. CXR this am shows a 15% right apical pneumothorax (s/p 12 French right chest tube by IR). WIll place to water seal soon. Check CXR in am.   ZIMMERMAN,DONIELLE MPA-C 12/09/2016,7:18 AM  No air leak today, follow up chest xray in am Poss water seal in am I have seen and examined Robin Valenzuela and agree with the above assessment  and plan.  Delight Ovens MD Beeper [phone] Office [phone] 12/09/2016 2:24 PM

## 2016-12-09 NOTE — Progress Notes (Signed)
Referring Physician(s):  Dr. Randa Evens  Supervising Physician: Gilmer Mor  Patient Status:  [facility] - In-pt  Chief Complaint:  Spontaneous pneumothorax  Subjective: Soreness related to tube.   Allergies: Adhesive [tape]  Medications: Prior to Admission medications   Medication Sig Start Date End Date Taking? Authorizing Provider  buPROPion (WELLBUTRIN XL) 150 MG 24 hr tablet Take 1 tablet (150 mg total) by mouth daily. Patient taking differently: Take 150 mg by mouth every morning.  11/20/16  Yes Sharlene Dory, PA-C  lisinopril (PRINIVIL,ZESTRIL) 10 MG tablet Take 10 mg by mouth daily.   Yes Historical Provider, MD  nicotine (NICODERM CQ - DOSED IN MG/24 HR) 7 mg/24hr patch Place 1 patch (7 mg total) onto the skin daily. 11/20/16  Yes Sharlene Dory, PA-C  PARoxetine (PAXIL) 40 MG tablet Take 1 tablet (40 mg total) by mouth daily. Patient taking differently: Take 40 mg by mouth at bedtime.  11/20/16  Yes Sharlene Dory, PA-C  NIFEdipine (PROCARDIA-XL/ADALAT CC) 60 MG 24 hr tablet Take 1 tablet (60 mg total) by mouth daily. Patient not taking: Reported on 11/15/2016 09/09/15   Sherian Rein, MD  Prenatal Vit-Fe Fumarate-FA (PRENATAL MULTIVITAMIN) TABS tablet Take 1 tablet by mouth daily at 12 noon. Patient not taking: Reported on 11/15/2016 09/08/15   Sherian Rein, MD  traMADol (ULTRAM) 50 MG tablet Take 1 tablet (50 mg total) by mouth every 12 (twelve) hours as needed. Patient not taking: Reported on 12/08/2016 11/25/16   Sharlene Dory, PA-C     Vital Signs: BP (!) 148/72 (BP Location: Right Arm)   Pulse 63   Temp 97.7 F (36.5 C) (Oral)   Resp 18   Ht  (1.727 m)   Wt 183 lb 3.2 oz (83.1 kg)   LMP 11/28/2016   SpO2 100%   BMI 27.86 kg/m   Physical Exam  NAD, alert Resp: normal effort, no distress, chest tube in place.  Insertion site clean and dry.  Imaging: Dg Chest 2 View  Addendum Date: 12/08/2016   ADDENDUM REPORT: 12/08/2016 13:03 ADDENDUM: Critical  Value/emergent results were called by telephone at the time of interpretation on 12/08/2016 at 12:47 pm to Dr. Sheliah Plane , who verbally acknowledged these results. Electronically Signed   By: Charlett Nose M.D.   On: 12/08/2016 13:03   Result Date: 12/08/2016 CLINICAL DATA:  Follow-up dramatic pneumothorax. EXAM: CHEST  2 VIEW COMPARISON:  11/20/2016 FINDINGS: Significant enlargement of the right pneumothorax, now all moderate to large over both the apex and left base, approximately 50%. Mild depression of the right hemidiaphragm. Cannot exclude early tension. Left lung is clear. Heart is normal size. IMPRESSION: Significant enlargement of the right pneumothorax, now approximately 50% with mild flattening of the right hemidiaphragm. Cannot exclude a component of tension. Attempts are being made at this time to contact Dr. Tyrone Sage with these results. Electronically Signed: By: Charlett Nose M.D. On: 12/08/2016 12:46   Dg Chest Port 1 View  Result Date: 12/09/2016 CLINICAL DATA:  Spontaneous pneumothorax, status post chest tube placement. EXAM: PORTABLE CHEST 1 VIEW COMPARISON:  CT scan of the chest of December 08, 2016 and chest x-ray of the same date FINDINGS: There is an approximately 15% right apical pneumothorax. This has decreased significantly since placement of the small caliber chest tube. The pigtail of the chest tube overlies the posterior aspect of the seventh rib. There is no pleural effusion. There is no mediastinal shift. The left lung is clear. The  heart and pulmonary vascularity are normal. The observed bony thorax is unremarkable. IMPRESSION: Partial re-expansion of the right lung with only approximately 15% apical pneumothorax remaining since chest tube placement. No acute changes otherwise. Electronically Signed   By: David  Swaziland M.D.   On: 12/09/2016 07:13   Ct Image Guided Fluid Drain By Catheter  Result Date: 12/09/2016 CLINICAL DATA:  Recurrent spontaneous pneumothorax. Previous  stapling of apical blebs. EXAM: CT GUIDED RIGHT CHEST TUBE PLACEMENT ANESTHESIA/SEDATION: Intravenous Fentanyl and Versed were administered as conscious sedation during continuous monitoring of the patient's level of consciousness and physiological / cardiorespiratory status by the radiology RN, with a total moderate sedation time of 15 minutes. PROCEDURE: The procedure, risks, benefits, and alternatives were explained to the patient. Questions regarding the procedure were encouraged and answered. The patient understands and consents to the procedure. Limited helical CT of the thorax was performed. The pneumothorax was identified and an appropriate skin entry site determined and marked. The operative field was prepped with chlorhexidinein a sterile fashion, and a sterile drape was applied covering the operative field. A sterile gown and sterile gloves were used for the procedure. Local anesthesia was provided with 1% Lidocaine. Under CT fluoroscopic guidance, a 19 gauge percutaneous entry needle was was advanced under the pleural space. Air could be aspirated. Amplatz wire advanced easily. 12French pigtail catheter advanced towards the apex anteriorly, and connected to Pleur-Evac suction. CT confirms appropriate catheter position and evacuation of much of the pneumothorax. Catheter secured with 0 Prolene suture, covered with Vaseline gauze and sterile gauze dressing, and secured to the Pleur-Evac. The patient tolerated the procedure well. COMPLICATIONS: None immediate FINDINGS: Large right pneumothorax. Small amount of layering pleural fluid. Right chest tube placement as above with evacuation of much of the visualized pneumothorax. IMPRESSION: 1. Recurrent large right pneumothorax, with CT-guided 12 French chest tube placement as above. Electronically Signed   By: Corlis Leak M.D.   On: 12/09/2016 11:40    Labs:  CBC:  Recent Labs  11/17/16 1615 11/18/16 0328 11/19/16 0246 12/08/16 1407  WBC 15.6* 7.4  7.1 7.3  HGB 13.9 12.6 12.5 13.3  HCT 42.1 38.7 37.9 40.2  PLT 291 250 236 364    COAGS:  Recent Labs  11/16/16 1411 11/17/16 1615  INR 1.01  --   APTT 30 30    BMP:  Recent Labs  11/16/16 1411 11/17/16 1615 11/18/16 0328 11/19/16 0246 12/08/16 1801  NA 138 137 138 138  --   K 3.4* 3.9 3.2* 3.6  --   CL 108 105 106 107  --   CO2 21* --   GLUCOSE 103* 96 100* 96  --   BUN 5* 5* <5* 6  --   CALCIUM 9.5 8.7* 8.6* 8.9  --   CREATININE 0.82 0.82 0.76 0.82 0.91  GFRNONAA >60 >60 >60 >60 >60  GFRAA >60 >60 >60 >60 >60    LIVER FUNCTION TESTS:  Recent Labs  11/16/16 1411  BILITOT 0.3  AST 28  ALT 22  ALKPHOS 65  PROT 7.4  ALBUMIN 3.4*    Assessment and Plan: Pneumothorax s/p chest tube placement 4/30 Chest tube in place and is to suction.  Per TCTS may put to water seal in AM.  Continue current drain care.  IR to follow.  Electronically Signed: Hoyt Koch 12/09/2016, 4:23 PM   I spent a total of 15 Minutes at the the patient's bedside AND on the patient's hospital floor  or unit, greater than 50% of which was counseling/coordinating care for pneumothorax

## 2016-12-10 ENCOUNTER — Inpatient Hospital Stay (HOSPITAL_COMMUNITY): Payer: Medicaid Other

## 2016-12-10 NOTE — Progress Notes (Addendum)
Referring Physician(s):  Dr. Randa Evens  Supervising Physician: Ruel Favors  Patient Status:  [facility] - In-pt  Chief Complaint:  Spontaneous pneumothorax  Subjective: Soreness improved.  Asking about water seal.   Allergies: Adhesive [tape]  Medications: Prior to Admission medications   Medication Sig Start Date End Date Taking? Authorizing Provider  buPROPion (WELLBUTRIN XL) 150 MG 24 hr tablet Take 1 tablet (150 mg total) by mouth daily. Patient taking differently: Take 150 mg by mouth every morning.  11/20/16  Yes Sharlene Dory, PA-C  lisinopril (PRINIVIL,ZESTRIL) 10 MG tablet Take 10 mg by mouth daily.   Yes Historical Provider, MD  nicotine (NICODERM CQ - DOSED IN MG/24 HR) 7 mg/24hr patch Place 1 patch (7 mg total) onto the skin daily. 11/20/16  Yes Sharlene Dory, PA-C  PARoxetine (PAXIL) 40 MG tablet Take 1 tablet (40 mg total) by mouth daily. Patient taking differently: Take 40 mg by mouth at bedtime.  11/20/16  Yes Sharlene Dory, PA-C  NIFEdipine (PROCARDIA-XL/ADALAT CC) 60 MG 24 hr tablet Take 1 tablet (60 mg total) by mouth daily. Patient not taking: Reported on 11/15/2016 09/09/15   Sherian Rein, MD  Prenatal Vit-Fe Fumarate-FA (PRENATAL MULTIVITAMIN) TABS tablet Take 1 tablet by mouth daily at 12 noon. Patient not taking: Reported on 11/15/2016 09/08/15   Sherian Rein, MD  traMADol (ULTRAM) 50 MG tablet Take 1 tablet (50 mg total) by mouth every 12 (twelve) hours as needed. Patient not taking: Reported on 12/08/2016 11/25/16   Sharlene Dory, PA-C     Vital Signs: BP 120/82 (BP Location: Left Arm)   Pulse 89   Temp 97.7 F (36.5 C) (Oral)   Resp 18   Ht  (1.727 m)   Wt 183 lb 3.2 oz (83.1 kg)   LMP 11/28/2016   SpO2 100%   BMI 27.86 kg/m   Physical Exam  NAD, alert Resp: normal effort, no respiratory distress, chest tube in place.  Insertion site clean and dry.  Less tender today.  Imaging: Dg Chest 2 View  Addendum Date: 12/08/2016     ADDENDUM REPORT: 12/08/2016 13:03 ADDENDUM: Critical Value/emergent results were called by telephone at the time of interpretation on 12/08/2016 at 12:47 pm to Dr. Sheliah Plane , who verbally acknowledged these results. Electronically Signed   By: Charlett Nose M.D.   On: 12/08/2016 13:03   Result Date: 12/08/2016 CLINICAL DATA:  Follow-up dramatic pneumothorax. EXAM: CHEST  2 VIEW COMPARISON:  11/20/2016 FINDINGS: Significant enlargement of the right pneumothorax, now all moderate to large over both the apex and left base, approximately 50%. Mild depression of the right hemidiaphragm. Cannot exclude early tension. Left lung is clear. Heart is normal size. IMPRESSION: Significant enlargement of the right pneumothorax, now approximately 50% with mild flattening of the right hemidiaphragm. Cannot exclude a component of tension. Attempts are being made at this time to contact Dr. Tyrone Sage with these results. Electronically Signed: By: Charlett Nose M.D. On: 12/08/2016 12:46   Dg Chest Port 1 View  Result Date: 12/10/2016 CLINICAL DATA:  Right-sided chest tube with pneumothorax EXAM: PORTABLE CHEST 1 VIEW COMPARISON:  Dec 09, 2016 FINDINGS: Chest tube remains on the right without change in position. Right apical pneumothorax is stable. No tension component. There is right base atelectasis. Lungs elsewhere clear. Heart size and pulmonary vascularity are normal. No adenopathy. No bone lesions. IMPRESSION: Stable pneumothorax on the right with chest tube in place. No tension component. Stable right base atelectasis. Lungs  elsewhere clear. Stable cardiac silhouette. Electronically Signed   By: Bretta Bang III M.D.   On: 12/10/2016 07:44   Dg Chest Port 1 View  Result Date: 12/09/2016 CLINICAL DATA:  Spontaneous pneumothorax, status post chest tube placement. EXAM: PORTABLE CHEST 1 VIEW COMPARISON:  CT scan of the chest of December 08, 2016 and chest x-ray of the same date FINDINGS: There is an approximately 15%  right apical pneumothorax. This has decreased significantly since placement of the small caliber chest tube. The pigtail of the chest tube overlies the posterior aspect of the seventh rib. There is no pleural effusion. There is no mediastinal shift. The left lung is clear. The heart and pulmonary vascularity are normal. The observed bony thorax is unremarkable. IMPRESSION: Partial re-expansion of the right lung with only approximately 15% apical pneumothorax remaining since chest tube placement. No acute changes otherwise. Electronically Signed   By: David  Swaziland M.D.   On: 12/09/2016 07:13   Ct Image Guided Fluid Drain By Catheter  Result Date: 12/09/2016 CLINICAL DATA:  Recurrent spontaneous pneumothorax. Previous stapling of apical blebs. EXAM: CT GUIDED RIGHT CHEST TUBE PLACEMENT ANESTHESIA/SEDATION: Intravenous Fentanyl and Versed were administered as conscious sedation during continuous monitoring of the patient's level of consciousness and physiological / cardiorespiratory status by the radiology RN, with a total moderate sedation time of 15 minutes. PROCEDURE: The procedure, risks, benefits, and alternatives were explained to the patient. Questions regarding the procedure were encouraged and answered. The patient understands and consents to the procedure. Limited helical CT of the thorax was performed. The pneumothorax was identified and an appropriate skin entry site determined and marked. The operative field was prepped with chlorhexidinein a sterile fashion, and a sterile drape was applied covering the operative field. A sterile gown and sterile gloves were used for the procedure. Local anesthesia was provided with 1% Lidocaine. Under CT fluoroscopic guidance, a 19 gauge percutaneous entry needle was was advanced under the pleural space. Air could be aspirated. Amplatz wire advanced easily. 12French pigtail catheter advanced towards the apex anteriorly, and connected to Pleur-Evac suction. CT confirms  appropriate catheter position and evacuation of much of the pneumothorax. Catheter secured with 0 Prolene suture, covered with Vaseline gauze and sterile gauze dressing, and secured to the Pleur-Evac. The patient tolerated the procedure well. COMPLICATIONS: None immediate FINDINGS: Large right pneumothorax. Small amount of layering pleural fluid. Right chest tube placement as above with evacuation of much of the visualized pneumothorax. IMPRESSION: 1. Recurrent large right pneumothorax, with CT-guided 12 French chest tube placement as above. Electronically Signed   By: Corlis Leak M.D.   On: 12/09/2016 11:40    Labs:  CBC:  Recent Labs  11/17/16 1615 11/18/16 0328 11/19/16 0246 12/08/16 1407  WBC 15.6* 7.4 7.1 7.3  HGB 13.9 12.6 12.5 13.3  HCT 42.1 38.7 37.9 40.2  PLT 291 250 236 364    COAGS:  Recent Labs  11/16/16 1411 11/17/16 1615  INR 1.01  --   APTT 30 30    BMP:  Recent Labs  11/16/16 1411 11/17/16 1615 11/18/16 0328 11/19/16 0246 12/08/16 1801  NA 138 137 138 138  --   K 3.4* 3.9 3.2* 3.6  --   CL 108 105 106 107  --   CO2 21* --   GLUCOSE 103* 96 100* 96  --   BUN 5* 5* <5* 6  --   CALCIUM 9.5 8.7* 8.6* 8.9  --   CREATININE 0.82 0.82  0.76 0.82 0.91  GFRNONAA >60 >60 >60 >60 >60  GFRAA >60 >60 >60 >60 >60    LIVER FUNCTION TESTS:  Recent Labs  11/16/16 1411  BILITOT 0.3  AST 28  ALT 22  ALKPHOS 65  PROT 7.4  ALBUMIN 3.4*    Assessment and Plan: Pneumothorax Patient soreness improved.  Chest tube remains in place.  To water seal per TCTS CXR showed residual apical pneumothorax.  IR to follow.   Electronically Signed: Hoyt Koch 12/10/2016, 2:04 PM   I spent a total of 15 Minutes at the the patient's bedside AND on the patient's hospital floor or unit, greater than 50% of which was counseling/coordinating care for pneumothorax

## 2016-12-10 NOTE — Progress Notes (Signed)
HR in 140s post ambulation.  Pt is asymptomatic. VVS stable. MD made aware.

## 2016-12-10 NOTE — Progress Notes (Addendum)
      [address]       [name] [address]             [phone]           Subjective: Patient without complaints this am.  Objective: Vital signs in last 24 hours: Temp:  [97.6 F (36.4 C)-97.8 F (36.6 C)] 97.6 F (36.4 C) (05/02 0434) Pulse Rate:  [63-80] 77 (05/02 0434) Cardiac Rhythm: Normal sinus rhythm (05/02 0700) Resp:  [16-18] 16 (05/02 0434) BP: (140-148)/(72-93) 148/93 (05/02 0434) SpO2:  [98 %-100 %] 98 % (05/02 0434)     Intake/Output from previous day: 05/01 0701 - 05/02 0700 In: 720 [P.O.:720] Out: 1439 [Urine:1350; Chest Tube:89]   Physical Exam:  Cardiovascular: RRR Pulmonary: Mostly lear to auscultation bilaterally-slightly diminished right apex. Chest Tube: to suction, no air leak  Lab Results: CBC:  Recent Labs  12/08/16 1407  WBC 7.3  HGB 13.3  HCT 40.2  PLT 364   BMET:   Recent Labs  12/08/16 1801  CREATININE 0.91    PT/INR: No results for input(s): LABPROT, INR in the last 72 hours. ABG:  INR: Will add last result for INR, ABG once components are confirmed Will add last 4 CBG results once components are confirmed  Assessment/Plan:  1. CV -  On Nifedipine 60 mg daily and Lisinopril 10 mg daily. 2.  Pulmonary - On room air. Chest tube is to suction and there is no air leak. CXR this am shows stable right apical pneumothorax (s/p 12 French right chest tube by IR). Will likely place to water seal . Check CXR in am.   ZIMMERMAN,DONIELLE MPA-C 12/10/2016,7:15 AM  Chest to water seal today  Poss remove tomorrow I have seen and examined Robin Valenzuela and agree with the above assessment  and plan.  Delight Ovens MD Beeper [phone] Office [phone] 12/10/2016 3:18 PM

## 2016-12-11 ENCOUNTER — Encounter (HOSPITAL_COMMUNITY): Payer: Self-pay | Admitting: Obstetrics and Gynecology

## 2016-12-11 ENCOUNTER — Inpatient Hospital Stay (HOSPITAL_COMMUNITY): Payer: Medicaid Other

## 2016-12-11 MED ORDER — OXYCODONE HCL 5 MG PO TABS: ORAL_TABLET | ORAL | 0 refills | Status: DC

## 2016-12-11 MED ORDER — ACETAMINOPHEN 325 MG PO TABS: 650.0000 mg | ORAL_TABLET | Freq: Four times a day (QID) | ORAL | Status: DC | PRN

## 2016-12-11 MED ORDER — LIDOCAINE HCL (PF) 1 % IJ SOLN
INTRAMUSCULAR | Status: AC
  Filled 2016-12-11: qty 5

## 2016-12-11 NOTE — Progress Notes (Signed)
12/11/2016 4:14 PM Discharge AVS meds taken today and those due this evening reviewed.  Follow-up appointments and when to call md reviewed.  D/C IV and TELE.  Questions and concerns addressed.   D/C home per orders. Kathryne HitchAllen, Anniebell Bedore C

## 2016-12-11 NOTE — Discharge Instructions (Signed)

## 2016-12-11 NOTE — Progress Notes (Signed)
CXR taken this afternoon shows stable right apical pneumothorax and questionable small stable right basilar pneumothorax. Chest tube is connected correctly to mini express. I did look at chest tube insertion site. The prolene suture placed by IR at the time of chest tube placemet is loose. I used betadine and 1% Xylocaine locally and then used a 2-0 silk suture to better secure chest tube to prevent it from accidentally falling out. Patient tolerated procedure well. She was instructed on importance of no strenuous activity (including lifting more than 5-10 pounds). Patient stable for discharge once second mini express is given to her. She will be seen in the office on Monday 12/15/2016.

## 2016-12-11 NOTE — Discharge Summary (Signed)
Physician Discharge Summary       [address] [place]       [name] [address]             [phone]    Patient ID: Robin Valenzuela MRN: [id number] DOB/AGE: [birth year] 35 y.o.  Admit date: 12/08/2016 Discharge date: 12/11/2016  Admission Diagnoses: Spontaneous recurrent,right pneumothorax  Active Diagnoses:  1. Spontaneous right pneumothorax (s/p right VATS, stapling of blebs, mechanical pleurodesis 04/09/2018_ 2. Tobacco abuse 3. Anxiety 4. Depression 5. Essential hypertension  6. Dichorionic diamniotic twin gestation  Consult: IR  Procedure (s): 68 French chest tube placed in the right pleural space by IR on 12/08/2016  History of Presenting Illness: This is a 36 yo white female well known to [facility].  She is S/P Right VATS with stapling of apical blebs, mechanical pleurodesis, and video bronchoscopy performed 11/17/2016.  She had an uncomplicated hospital course and was later discharged home.  She initially did okay at discharge.  She was contacted at home by our office at which time she was experiencing some increased right sided chest pain.  This was however attributed to possibly lifting her kids.  She had her sutures removed on 11/28/2016 again at which time she was experiencing some discomfort but was taking Tramadol for relief.  She presented to the office today for hospital follow up.  She states that she has had terrible right sided pain for the past 3 days.  She states that prior to all of this she could walk and hike without difficulties.  However she went fishing yesterday and struggled to walk up the hill due to her chest pain and shortness of breath.  She also states on Friday she noticed some numbness of her lips and nose, which happened when she presented with her previous pneumothorax.  She is no longer smoking and is currently utilizing nicotine patches.  She states she did not want to come today because she had a feeling her pneumothorax was back.  She is visibly  upset and crying.  She was admitted for further evaluation and treatment.  Brief Hospital Course:  She has remained afebrile and hemodynamically stable. Daily chest x rays were obtained and remained stable. Chest tube did not have an air leak. Her chest tube was placed to water seal on 05/02. Chest x ray on 05/03 showed a stable right apical pneumothorax and a small new basilar pneumothorax. As discussed with Dr. Tyrone Sage, chest placed to a mini express. Repeat PA/LAT CXR later showed stable, persistent small right apical pneumothorax. Right-sided chest tube in unchanged position.Marland Kitchen She was felt surgically stable for discharge.   Latest Vital Signs: Blood pressure 120/79, pulse 96, temperature 97.8 F (36.6 C), temperature source Oral, resp. rate 18, height 5\' 8"  (1.727 m), weight 83.1 kg (183 lb 3.2 oz), last menstrual period 11/28/2016, SpO2 100 %, unknown if currently breastfeeding.  Physical Exam: Cardiovascular: RRR Pulmonary: Mostly clear to auscultation bilaterally Chest Tube: to water leak, no air leak  Discharge Condition:Stable and discharged to home.  Recent laboratory studies:  Lab Results  Component Value Date   WBC 7.3 12/08/2016   HGB 13.3 12/08/2016   HCT 40.2 12/08/2016   MCV 83.6 12/08/2016   PLT 364 12/08/2016   Lab Results  Component Value Date   NA 138 11/19/2016   K 3.6 11/19/2016   CL 107 11/19/2016   CO2 24 11/19/2016   CREATININE 0.91 12/08/2016   GLUCOSE 96 11/19/2016      Diagnostic  Studies:  CLINICAL DATA:  Pneumothorax  EXAM: CHEST  2 VIEW  COMPARISON:  12/11/2016  FINDINGS: Right-sided chest tube in unchanged position. Persistent small right apical pneumothorax unchanged compared with 12/11/2016. Trace right pleural effusion. No focal consolidation. No left pleural effusion or pneumothorax. Stable cardiomediastinal silhouette.  No acute osseous abnormality.  IMPRESSION: Stable, persistent small right apical pneumothorax.  Right-sided chest tube in unchanged position.   Electronically Signed   By: Elige Ko   On: 12/11/2016 14:43  Dg Chest 1 View  Result Date: 12/11/2016 CLINICAL DATA:  Follow-up right-sided pneumothorax. EXAM: CHEST 1 VIEW COMPARISON:  Portable chest x-ray of Dec 10, 2016 FINDINGS: There remains a right-sided hydropneumothorax. The pleural line in the apex lies between the third and fourth posterior ribs. Small air-fluid levels at the right lung base are better demonstrated today there is also a pleural line inferiorly and laterally in the right pleural space which was not visible yesterday. There is no mediastinal shift. Small caliber chest tube has its coil projecting over the posteromedial aspect of the right ninth rib slightly lower than on yesterday's study. There is no alveolar infiltrate. The left lung is clear and well expanded. The heart and pulmonary vascularity are normal. The observed bony thorax exhibits no acute abnormality. IMPRESSION: Slight interval increase in the size of the right-sided hydropneumothorax. The amount of lung volume involved is approximately 15-20%. There is no mediastinal shift. The small caliber chest tube remains present but is slightly more inferiorly position. Electronically Signed   By: David  [place] M.D.   On: 12/11/2016 07:18   Dg Chest 2 View  Addendum Date: 12/08/2016   ADDENDUM REPORT: 12/08/2016 13:03 ADDENDUM: Critical Value/emergent results were called by telephone at the time of interpretation on 12/08/2016 at 12:47 pm to Dr. Sheliah Plane , who verbally acknowledged these results. Electronically Signed   By: Charlett Nose M.D.   On: 12/08/2016 13:03   Result Date: 12/08/2016 CLINICAL DATA:  Follow-up dramatic pneumothorax. EXAM: CHEST  2 VIEW COMPARISON:  11/20/2016 FINDINGS: Significant enlargement of the right pneumothorax, now all moderate to large over both the apex and left base, approximately 50%. Mild depression of the right hemidiaphragm.  Cannot exclude early tension. Left lung is clear. Heart is normal size. IMPRESSION: Significant enlargement of the right pneumothorax, now approximately 50% with mild flattening of the right hemidiaphragm. Cannot exclude a component of tension. Attempts are being made at this time to contact Dr. Tyrone Sage with these results. Electronically Signed: By: Charlett Nose M.D. On: 12/08/2016 12:46    Ct Image Guided Fluid Drain By Catheter  Result Date: 12/09/2016 CLINICAL DATA:  Recurrent spontaneous pneumothorax. Previous stapling of apical blebs. EXAM: CT GUIDED RIGHT CHEST TUBE PLACEMENT ANESTHESIA/SEDATION: Intravenous Fentanyl and Versed were administered as conscious sedation during continuous monitoring of the patient's level of consciousness and physiological / cardiorespiratory status by the radiology RN, with a total moderate sedation time of 15 minutes. PROCEDURE: The procedure, risks, benefits, and alternatives were explained to the patient. Questions regarding the procedure were encouraged and answered. The patient understands and consents to the procedure. Limited helical CT of the thorax was performed. The pneumothorax was identified and an appropriate skin entry site determined and marked. The operative field was prepped with chlorhexidinein a sterile fashion, and a sterile drape was applied covering the operative field. A sterile gown and sterile gloves were used for the procedure. Local anesthesia was provided with 1% Lidocaine. Under CT fluoroscopic guidance, a 19 gauge percutaneous entry  needle was was advanced under the pleural space. Air could be aspirated. Amplatz wire advanced easily. 12French pigtail catheter advanced towards the apex anteriorly, and connected to Pleur-Evac suction. CT confirms appropriate catheter position and evacuation of much of the pneumothorax. Catheter secured with 0 Prolene suture, covered with Vaseline gauze and sterile gauze dressing, and secured to the Pleur-Evac. The  patient tolerated the procedure well. COMPLICATIONS: None immediate FINDINGS: Large right pneumothorax. Small amount of layering pleural fluid. Right chest tube placement as above with evacuation of much of the visualized pneumothorax. IMPRESSION: 1. Recurrent large right pneumothorax, with CT-guided 12 French chest tube placement as above. Electronically Signed   By: D  Hassell M.D.   On: 12/09/2016 11:40    Discharge Medications: Allergies as of 12/11/2016      Reactions   Adhesive [tape]    Causes skin irritation, when tape is removed her skinCorlis Leak comes off too      Medication List    STOP taking these medications   traMADol 50 MG tablet Commonly known as:  ULTRAM     TAKE these medications   acetaminophen 325 MG tablet Commonly known as:  TYLENOL Take 2 tablets (650 mg total) by mouth every 6 (six) hours as needed for mild pain or fever.   buPROPion 150 MG 24 hr tablet Commonly known as:  WELLBUTRIN XL Take 1 tablet (150 mg total) by mouth daily. What changed:  when to take this   lisinopril 10 MG tablet Commonly known as:  PRINIVIL,ZESTRIL Take 10 mg by mouth daily.   nicotine 7 mg/24hr patch Commonly known as:  NICODERM CQ - dosed in mg/24 hr Place 1 patch (7 mg total) onto the skin daily.   NIFEdipine 60 MG 24 hr tablet Commonly known as:  PROCARDIA-XL/ADALAT CC Take 1 tablet (60 mg total) by mouth daily.   oxyCODONE 5 MG immediate release tablet Commonly known as:  Oxy IR/ROXICODONE Take 5 mg by mouth every 4-6 hours PRN severe pain.   PARoxetine 40 MG tablet Commonly known as:  PAXIL Take 1 tablet (40 mg total) by mouth daily. What changed:  when to take this   prenatal multivitamin Tabs tablet Take 1 tablet by mouth daily at 12 noon.       Follow Up Appointments: Follow-up Information    Delight OvensEdward B Gerhardt, MD Follow up on 12/15/2016.   Specialty:  Cardiothoracic Surgery Why:  PA/LAT CXR to be taken (at [facility] which is in the same building as Dr.  Tyrone SageGerhardt) one hour prior to office appointment on 12/15/2016 at 2:45 pm;Appointment time is at 3:15 pm and is with physician assistant Contact information: [address] [place] [address] [phone]        Hal MoralesGunter, Tara G, NP Follow up.   Specialty:  Nurse Practitioner Why:  Please call for a follow up appointment regarding further management of blood pressure Contact information: [address] [place] [address] [phone]           Signed: Haskell Rihn MPA-C 12/11/2016, 3:27 PM

## 2016-12-11 NOTE — Progress Notes (Signed)
Pleura Vac switched to a mini express.

## 2016-12-11 NOTE — Progress Notes (Addendum)
      [address]       [place] [id number]             [phone]           Subjective: Patient without complaints this am.  Objective: Vital signs in last 24 hours: Temp:  [97.7 F (36.5 C)-98.5 F (36.9 C)] 98.1 F (36.7 C) (05/03 0523) Pulse Rate:  [71-95] 95 (05/03 0523) Cardiac Rhythm: Normal sinus rhythm (05/03 0700) Resp:  [18] 18 (05/03 0523) BP: (120-146)/(77-117) 136/117 (05/03 0523) SpO2:  [97 %-100 %] 97 % (05/03 0523)     Intake/Output from previous day: 05/02 0701 - 05/03 0700 In: 1440 [P.O.:1440] Out: 1951 [Urine:1950; Stool:1]   Physical Exam:  Cardiovascular: RRR Pulmonary: Mostly clear to auscultation bilaterally Chest Tube: to water leak, no air leak  Lab Results: CBC:  Recent Labs  12/08/16 1407  WBC 7.3  HGB 13.3  HCT 40.2  PLT 364   BMET:   Recent Labs  12/08/16 1801  CREATININE 0.91    PT/INR: No results for input(s): LABPROT, INR in the last 72 hours. ABG:  INR: Will add last result for INR, ABG once components are confirmed Will add last 4 CBG results once components are confirmed  Assessment/Plan:  1. CV -  On Nifedipine 60 mg daily and Lisinopril 10 mg daily as taken prior to admission. 2.  Pulmonary - On room air. (s/p 12 French right chest tube by IR).Chest tube is to suction and there is no air leak. CXR this am shows stable right apical pneumothorax but now with a small right basilar pneumothorax as well. As discussed with Dr. Tyrone SageGerhardt, will change Pleura Vac to a mini express, obtain PA/LAT CXR this afternoon. If CXR, stable, discharge later today. Patient to follow up on Monday 12/15/2016 with CXR. Patient agreeable to above plan.    Helton Oleson MPA-C 12/11/2016,7:46 AM

## 2016-12-15 ENCOUNTER — Other Ambulatory Visit: Payer: Self-pay | Admitting: Cardiothoracic Surgery

## 2016-12-15 ENCOUNTER — Ambulatory Visit (INDEPENDENT_AMBULATORY_CARE_PROVIDER_SITE_OTHER): Payer: Self-pay | Admitting: Surgical

## 2016-12-15 ENCOUNTER — Ambulatory Visit
Admission: RE | Admit: 2016-12-15 | Discharge: 2016-12-15 | Disposition: A | Discharge status: Home / Self Care | Payer: Medicaid Other | Source: Ambulatory Visit | Attending: Cardiothoracic Surgery | Admitting: Cardiothoracic Surgery

## 2016-12-15 VITALS — BP 142/87 | HR 96 | Resp 20 | Ht 68.0 in | Wt 184.0 lb

## 2016-12-15 DIAGNOSIS — Z9689 Presence of other specified functional implants: Secondary | ICD-10-CM

## 2016-12-15 DIAGNOSIS — J939 Pneumothorax, unspecified: Secondary | ICD-10-CM

## 2016-12-15 NOTE — Progress Notes (Signed)
[address]       [place] [id number]             [phone]                  Robin EvertKrystal L [facility]enzuela [facility] Medical Record #[id number] Date of Birth: Nov 12, 1980  Referring OZ:[id number]:Valenzuela, Robin Kidaara G, NP Primary Cardiology: Primary Care:Valenzuela, Robin Kidaara G, NP    Chief Complaint:  Follow Up Visit Previous right pigtail placed for recurrent pneumothorax. Status post previous right vats for bleb resection.   History of Present Illness:    This patient is seen in the office in follow-up. She was discharged on 12/11/2016 with a mini express attached to a pigtail catheter. The pneumothorax is stable at approximately 5% and she is having no significant symptoms of shortness of breath. She does have some mild muscular discomfort in the region of the tube.         Zubrod Score: At the time of surgery this patient's most appropriate activity status/level should be described as: []     0    Normal activity, no symptoms []     1    Restricted in physical strenuous activity but ambulatory, able to do out light work []     2    Ambulatory and capable of self care, unable to do work activities, up and about                 >50 % of waking hours                                                                                   []     3    Only limited self care, in bed greater than 50% of waking hours []     4    Completely disabled, no self care, confined to bed or chair []     5    Moribund  History  Smoking Status  . Former Smoker  . Packs/day: 0.50  . Years: 15.00  . Types: Cigarettes  . Quit date: 11/10/2016  Smokeless Tobacco  . Never Used    Comment: ON THE PATCH       Allergies  Allergen Reactions  . Adhesive [Tape]     Causes skin irritation, when tape is removed her skin comes off too    Current Outpatient Prescriptions  Medication Sig Dispense Refill  . acetaminophen (TYLENOL) 325 MG tablet Take 2 tablets (650 mg total) by mouth every 6 (six) hours as needed for  mild pain or fever.    Marland Kitchen. buPROPion (WELLBUTRIN XL) 150 MG 24 hr tablet Take 1 tablet (150 mg total) by mouth daily. (Patient taking differently: Take 150 mg by mouth every morning. ) 30 tablet 0  . lisinopril (PRINIVIL,ZESTRIL) 10 MG tablet Take 10 mg by mouth daily.    . nicotine (NICODERM CQ - DOSED IN MG/24 HR) 7 mg/24hr patch Place 1 patch (7 mg total) onto the skin daily. 28 patch 0  . NIFEdipine (PROCARDIA-XL/ADALAT CC) 60 MG 24 hr tablet Take 1 tablet (60 mg total) by mouth daily. 30 tablet 3  . oxyCODONE (OXY IR/ROXICODONE)  5 MG immediate release tablet Take 5 mg by mouth every 4-6 hours PRN severe pain. 28 tablet 0  . PARoxetine (PAXIL) 40 MG tablet Take 1 tablet (40 mg total) by mouth daily. (Patient taking differently: Take 40 mg by mouth at bedtime. ) 30 tablet 0  . Prenatal Vit-Fe Fumarate-FA (PRENATAL MULTIVITAMIN) TABS tablet Take 1 tablet by mouth daily at 12 noon. 30 tablet 12   No current facility-administered medications for this visit.        Physical Exam: BP (!) 142/87   Pulse 96   Resp 20   Ht 5\' 8"  (1.727 m)   Wt 184 lb (83.5 kg)   LMP 11/28/2016   SpO2 98%   BMI 27.98 kg/m   General appearance: alert, cooperative and no distress Heart: regular rate and rhythm Lungs: clear to auscultation bilaterally Wounds:well healed  Diagnostic Studies & Laboratory data:         Recent Radiology Findings: Dg Chest 2 View  Result Date: 12/15/2016 CLINICAL DATA:  History of RIGHT pneumothorax. Continued surveillance.RIGHT chest pain. EXAM: CHEST  2 VIEW COMPARISON:  12/11/2016. FINDINGS: Pigtail catheter remains in good position on the RIGHT chest, slowly down turning. There is medial and small RIGHT apical pneumothorax, less than 5%. LEFT lung clear.  Normal cardiomediastinal silhouette. IMPRESSION: Persistent RIGHT apical pneumothorax, less than 5%. Electronically Signed   By: Elsie Stain M.D.   On: 12/15/2016 15:10      I have independently reviewed the above  radiology findings and reviewed findings  with the patient.  Recent Labs: Lab Results  Component Value Date   WBC 7.3 12/08/2016   HGB 13.3 12/08/2016   HCT 40.2 12/08/2016   PLT 364 12/08/2016   GLUCOSE 96 11/19/2016   ALT 22 11/16/2016   AST 28 11/16/2016   NA 138 11/19/2016   K 3.6 11/19/2016   CL 107 11/19/2016   CREATININE 0.91 12/08/2016   BUN 6 11/19/2016   CO2 24 11/19/2016   INR 1.01 11/16/2016      Assessment / Plan:  The patient has a very stable tiny right apical pneumothorax. I removed the tube. The plan is for her to return to the office on Thursday with a repeat chest x-ray. If she has any difficulty prior to that she knows very well to either go to the emergency room or call our office.        Robin Valenzuela E 12/15/2016 3:41 PM

## 2016-12-15 NOTE — Patient Instructions (Signed)
No heavy lifting or exertion °

## 2016-12-16 ENCOUNTER — Other Ambulatory Visit: Payer: Self-pay | Admitting: *Deleted

## 2016-12-16 DIAGNOSIS — G8918 Other acute postprocedural pain: Secondary | ICD-10-CM

## 2016-12-16 MED ORDER — OXYCODONE HCL 5 MG PO TABS: ORAL_TABLET | ORAL | 0 refills | Status: DC

## 2016-12-18 ENCOUNTER — Ambulatory Visit
Admission: RE | Admit: 2016-12-18 | Discharge: 2016-12-18 | Disposition: A | Discharge status: Home / Self Care | Payer: Medicaid Other | Source: Ambulatory Visit | Attending: Cardiothoracic Surgery | Admitting: Cardiothoracic Surgery

## 2016-12-18 ENCOUNTER — Ambulatory Visit (INDEPENDENT_AMBULATORY_CARE_PROVIDER_SITE_OTHER): Payer: Self-pay | Admitting: Cardiothoracic Surgery

## 2016-12-18 ENCOUNTER — Encounter: Payer: Self-pay | Admitting: Cardiothoracic Surgery

## 2016-12-18 ENCOUNTER — Other Ambulatory Visit: Payer: Self-pay

## 2016-12-18 VITALS — BP 149/94 | HR 88 | Resp 16 | Ht 68.0 in | Wt 184.0 lb

## 2016-12-18 DIAGNOSIS — J9311 Primary spontaneous pneumothorax: Secondary | ICD-10-CM

## 2016-12-18 DIAGNOSIS — J939 Pneumothorax, unspecified: Secondary | ICD-10-CM

## 2016-12-18 DIAGNOSIS — Z09 Encounter for follow-up examination after completed treatment for conditions other than malignant neoplasm: Secondary | ICD-10-CM

## 2016-12-18 DIAGNOSIS — J439 Emphysema, unspecified: Secondary | ICD-10-CM

## 2016-12-18 NOTE — Progress Notes (Addendum)
[address]       [place] [address]             [phone]                  Robin Valenzuela [facility] Medical Record #[id number] Date of Birth: 1981-08-10  Referring IE:PPIRJJ, Yolonda Kida, NP Primary Cardiology: Primary Care:Gunter, Yolonda Kida, NP    Chief Complaint:  Follow Up Visit Previous right pigtail placed for recurrent pneumothorax. Status post previous right vats for bleb resection. 11/17/2016 OPERATIVE REPORT PREOPERATIVE DIAGNOSIS:  Recurrent spontaneous pneumothorax on the right. POSTOPERATIVE DIAGNOSIS:  Recurrent spontaneous pneumothorax on the right. SURGICAL PROCEDURE:  Bronchoscopy, right video-assisted thoracoscopy, stapling of apical blebs, and mechanical pleurodesis. SURGEON:  Sheliah Plane, MD.   History of Present Illness:     Patient returns today with a follow-up chest x-ray having had a chest tube removed on Monday in the office. She notes she feels much better denies shortness of breath.   Zubrod Score: At the time of surgery this patient's most appropriate activity status/level should be described as: []     0    Normal activity, no symptoms [x]     1    Restricted in physical strenuous activity but ambulatory, able to do out light work []     2    Ambulatory and capable of self care, unable to do work activities, up and about                 >50 % of waking hours                                                                                   []     3    Only limited self care, in bed greater than 50% of waking hours []     4    Completely disabled, no self care, confined to bed or chair []     5    Moribund  History  Smoking Status  . Former Smoker  . Packs/day: 0.50  . Years: 15.00  . Types: Cigarettes  . Quit date: 11/10/2016  Smokeless Tobacco  . Never Used    Comment: ON THE PATCH       Allergies  Allergen Reactions  . Adhesive [Tape]     Causes skin irritation, when tape is removed her skin comes off too     Current Outpatient Prescriptions  Medication Sig Dispense Refill  . acetaminophen (TYLENOL) 325 MG tablet Take 2 tablets (650 mg total) by mouth every 6 (six) hours as needed for mild pain or fever.    Marland Kitchen buPROPion (WELLBUTRIN XL) 150 MG 24 hr tablet Take 1 tablet (150 mg total) by mouth daily. (Patient taking differently: Take 150 mg by mouth every morning. ) 30 tablet 0  . lisinopril (PRINIVIL,ZESTRIL) 10 MG tablet Take 10 mg by mouth daily.    . nicotine (NICODERM CQ - DOSED IN MG/24 HR) 7 mg/24hr patch Place 1 patch (7 mg total) onto the skin daily. 28 patch 0  . NIFEdipine (PROCARDIA-XL/ADALAT CC) 60 MG 24 hr tablet Take 1 tablet (60 mg total) by  mouth daily. 30 tablet 3  . oxyCODONE (OXY IR/ROXICODONE) 5 MG immediate release tablet Take 5 mg by mouth every 6 hours PRN severe pain. 20 tablet 0  . PARoxetine (PAXIL) 40 MG tablet Take 1 tablet (40 mg total) by mouth daily. (Patient taking differently: Take 40 mg by mouth at bedtime. ) 30 tablet 0  . Prenatal Vit-Fe Fumarate-FA (PRENATAL MULTIVITAMIN) TABS tablet Take 1 tablet by mouth daily at 12 noon. 30 tablet 12   No current facility-administered medications for this visit.          Physical Exam: BP (!) 149/94 (BP Location: Right Arm, Patient Position: Sitting, Cuff Size: Large)   Pulse 88   Resp 16   Ht 5\' 8"  (1.727 m)   Wt 184 lb (83.5 kg)   LMP 11/28/2016   SpO2 99% Comment: RA  BMI 27.98 kg/m   General appearance: alert, cooperative and no distress Heart: regular rate and rhythm Lungs: clear to auscultation bilaterally Wounds:well healed  Diagnostic Studies & Laboratory data:         Recent Radiology Findings: Dg Chest 2 View  Result Date: 12/18/2016 CLINICAL DATA:  RIGHT-side chest discomfort, COPD, prior VATS with wedge resection on 11/17/2016 EXAM: CHEST  2 VIEW COMPARISON:  12/15/2016 FINDINGS: Interval removal of RIGHT thoracostomy tube. Normal heart size, mediastinal contours, and pulmonary vascularity.  Lungs clear. No pleural effusion or pneumothorax. Bones unremarkable. IMPRESSION: No definite acute abnormalities. Electronically Signed   By: Ulyses SouthwardMark  Boles M.D.   On: 12/18/2016 11:56      I have independently reviewed the above radiology findings and reviewed findings  with the patient.  Recent Labs: Lab Results  Component Value Date   WBC 7.3 12/08/2016   HGB 13.3 12/08/2016   HCT 40.2 12/08/2016   PLT 364 12/08/2016   GLUCOSE 96 11/19/2016   ALT 22 11/16/2016   AST 28 11/16/2016   NA 138 11/19/2016   K 3.6 11/19/2016   CL 107 11/19/2016   CREATININE 0.91 12/08/2016   BUN 6 11/19/2016   CO2 24 11/19/2016   INR 1.01 11/16/2016      Assessment / Plan:   No evidence of recurrent pneumothorax after removal of chest tube on the right several days ago Patient was cautioned about heavy lifting or exertion for several weeks She will return in 4-6 weeks with a follow-up chest x-ray She continues to not smoke  Robin Valenzuela 12/18/2016 12:35 PM

## 2017-01-29 ENCOUNTER — Ambulatory Visit: Payer: Medicaid Other | Admitting: Cardiothoracic Surgery

## 2017-02-02 ENCOUNTER — Other Ambulatory Visit: Payer: Self-pay | Admitting: Cardiothoracic Surgery

## 2017-02-02 DIAGNOSIS — J9383 Other pneumothorax: Secondary | ICD-10-CM

## 2017-02-09 ENCOUNTER — Ambulatory Visit: Payer: Medicaid Other

## 2017-02-23 ENCOUNTER — Ambulatory Visit (INDEPENDENT_AMBULATORY_CARE_PROVIDER_SITE_OTHER): Payer: Medicaid Other | Admitting: Physician Assistant

## 2017-02-23 ENCOUNTER — Ambulatory Visit
Admission: RE | Admit: 2017-02-23 | Discharge: 2017-02-23 | Disposition: A | Discharge status: Home / Self Care | Payer: Medicaid Other | Source: Ambulatory Visit | Attending: Cardiothoracic Surgery | Admitting: Cardiothoracic Surgery

## 2017-02-23 VITALS — BP 145/88 | HR 71 | Resp 20 | Ht 68.0 in | Wt 185.0 lb

## 2017-02-23 DIAGNOSIS — J939 Pneumothorax, unspecified: Secondary | ICD-10-CM | POA: Diagnosis not present

## 2017-02-23 DIAGNOSIS — J439 Emphysema, unspecified: Secondary | ICD-10-CM | POA: Diagnosis not present

## 2017-02-23 DIAGNOSIS — J9383 Other pneumothorax: Secondary | ICD-10-CM

## 2017-02-23 DIAGNOSIS — Z09 Encounter for follow-up examination after completed treatment for conditions other than malignant neoplasm: Secondary | ICD-10-CM

## 2017-02-23 NOTE — Progress Notes (Signed)
HPI: Patient returns for routine  6 week follow up.  She is S/P VATS procedure with pleurodesis and resection of blebs for current pneumothorax.  She states she is doing very well.  She continues to not smoke.  She does occasionally have some numbness and burning near her surgical incisions and along her right breast.  She otherwise has no complaints.   Current Outpatient Prescriptions  Medication Sig Dispense Refill  . buPROPion (WELLBUTRIN XL) 150 MG 24 hr tablet Take 1 tablet (150 mg total) by mouth daily. (Patient taking differently: Take 150 mg by mouth every morning. ) 30 tablet 0  . lisinopril (PRINIVIL,ZESTRIL) 10 MG tablet Take 10 mg by mouth daily.    Marland Kitchen. NIFEdipine (PROCARDIA-XL/ADALAT CC) 60 MG 24 hr tablet Take 1 tablet (60 mg total) by mouth daily. 30 tablet 3  . PARoxetine (PAXIL) 40 MG tablet Take 1 tablet (40 mg total) by mouth daily. (Patient taking differently: Take 40 mg by mouth at bedtime. ) 30 tablet 0  . Prenatal Vit-Fe Fumarate-FA (PRENATAL MULTIVITAMIN) TABS tablet Take 1 tablet by mouth daily at 12 noon. 30 tablet 12   No current facility-administered medications for this visit.     Physical Exam:  BP (!) 145/88   Pulse 71   Resp 20   Ht 5\' 8"  (1.727 m)   Wt 185 lb (83.9 kg)   LMP 02/17/2017   SpO2 99% Comment: RA  BMI 28.13 kg/m   Gen: no apparent distress Heart: RRR Lungs: CTA bilaterally Incisions: well healed  Diagnostic Tests:  CXR; stable, no pneumothorax   A/P:  1. S/p Right VATS 11/17/2016, readmitted for recurrent pneumothorax in May, CXR is free from pneumothorax 2. 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 Mild Hypertension- follow up with PCP 5. RTC prn  Lowella DandyBARRETT, Velencia Lenart, PA-C [facility] [phone]

## 2017-04-02 NOTE — Addendum Note (Signed)
Addendum  created 04/02/17 1250 by Alaiza Yau, MD   Sign clinical note    

## 2017-10-09 IMAGING — DX DG CHEST 2V
2 series · 2 of 2 positions shown · non-contrast
Comparison: 11/20/2016

ADDENDUM:
Critical Value/emergent results were called by telephone at the time
of interpretation on 12/08/2016 at [DATE] to Dr. MISKA-MATIAS BEHM ,
who verbally acknowledged these results.
CLINICAL DATA: Follow-up dramatic pneumothorax.

EXAM:
CHEST  2 VIEW

[dg chest 2 view (1 of 2)]
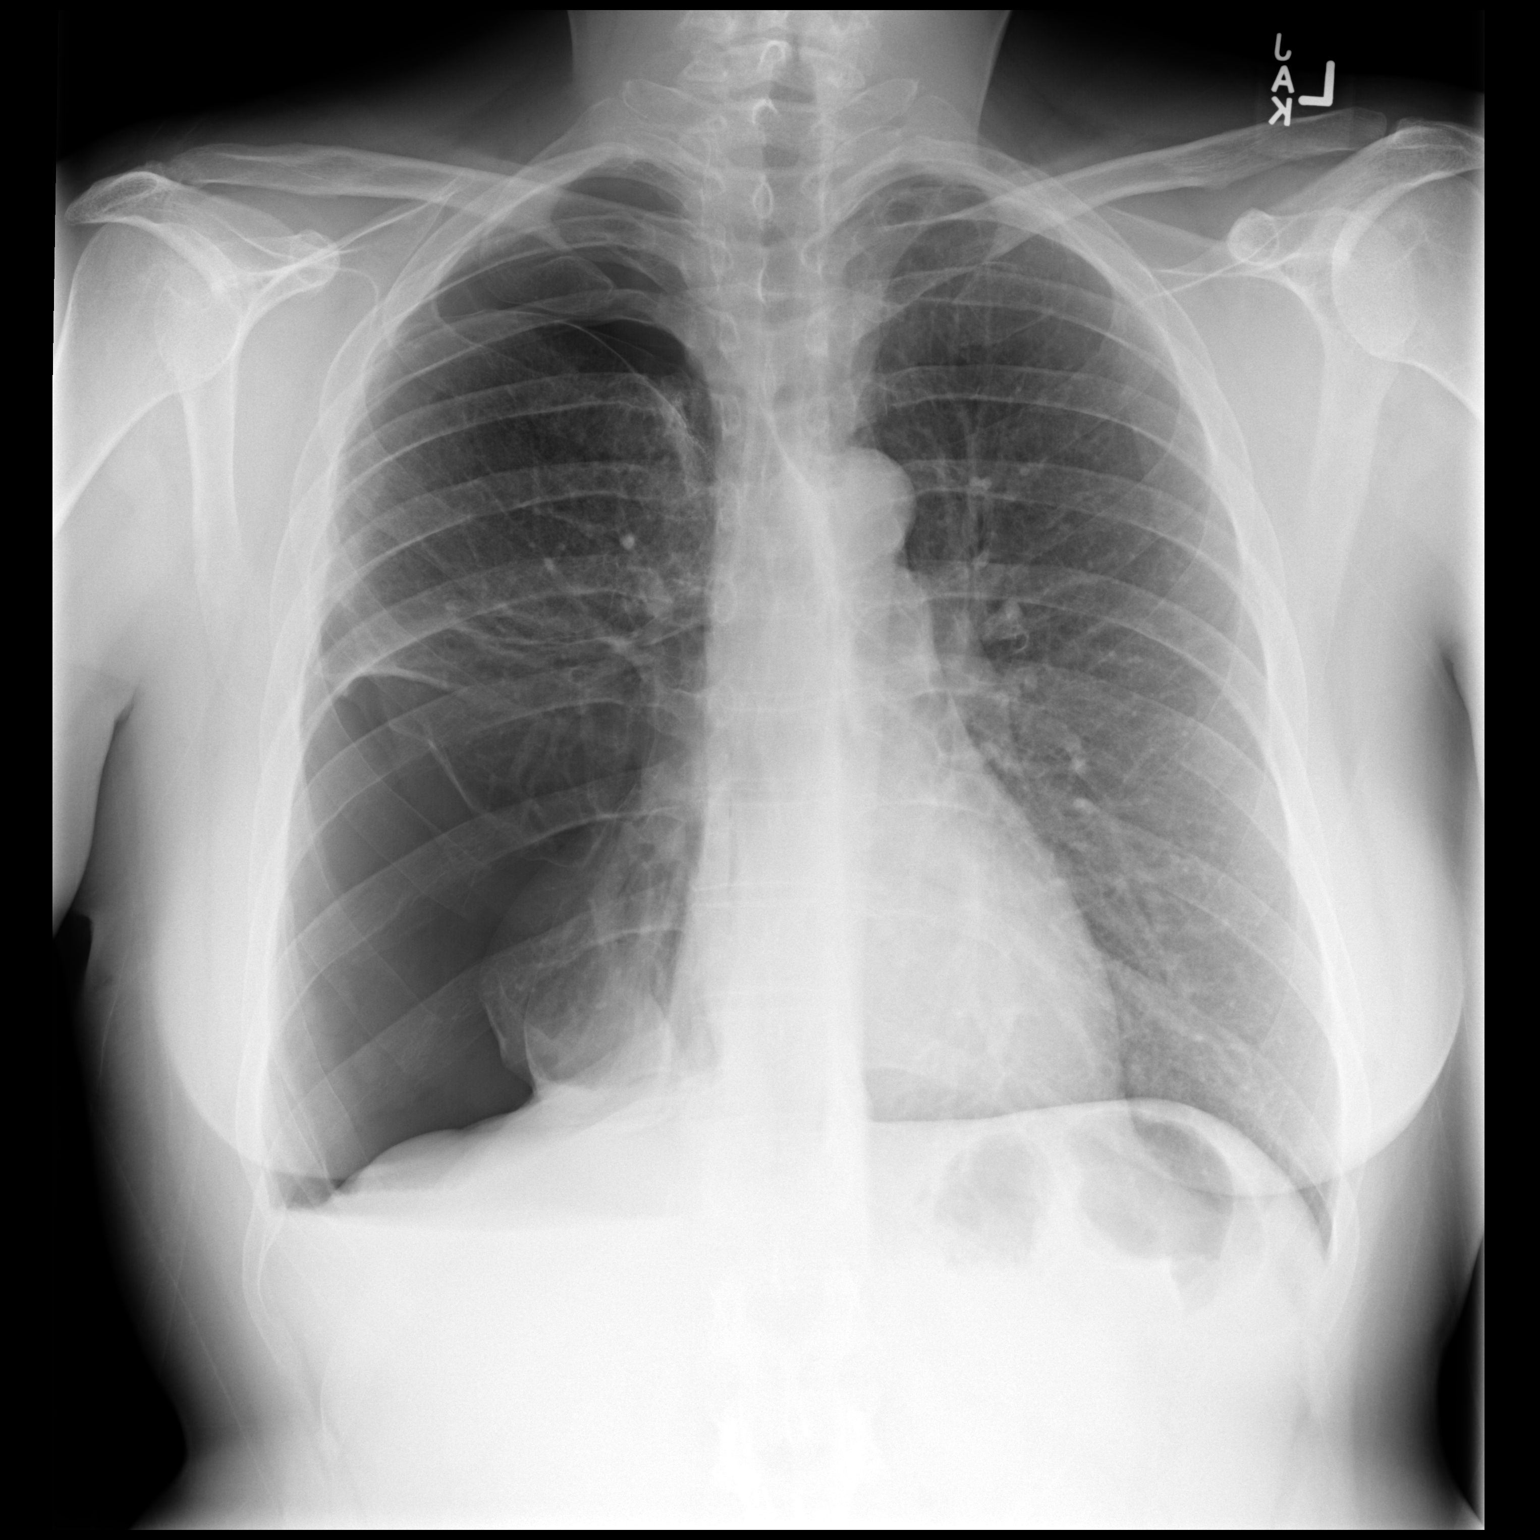

[dg chest 2 view (2 of 2)]
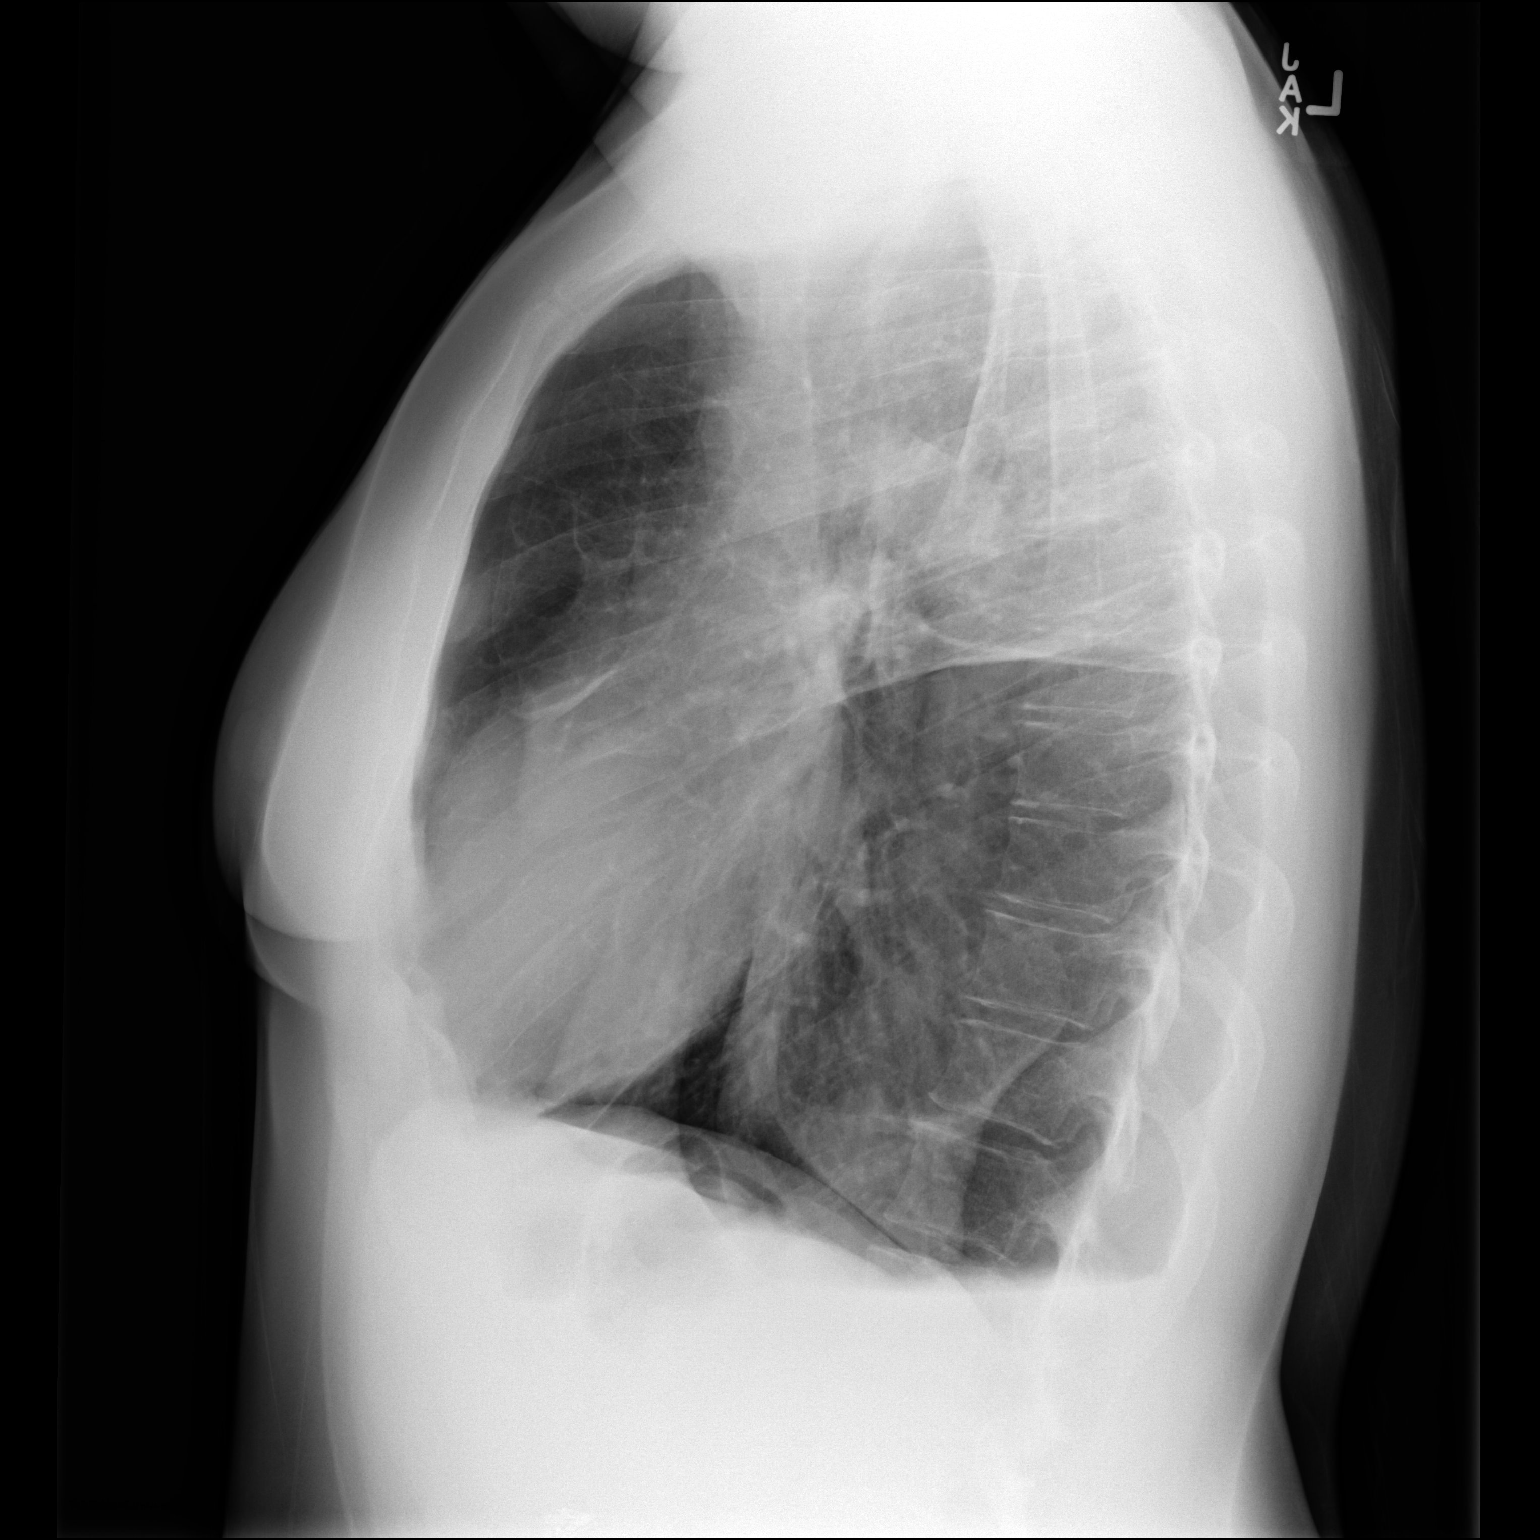

[2 of 2 positions shown; findings below may reference images not displayed]

FINDINGS: Significant enlargement of the right pneumothorax, now all moderate
to large over both the apex and left base, approximately 50%. Mild
depression of the right hemidiaphragm. Cannot exclude early tension.
Left lung is clear. Heart is normal size.
IMPRESSION: Significant enlargement of the right pneumothorax, now approximately
50% with mild flattening of the right hemidiaphragm. Cannot exclude
a component of tension.

Attempts are being made at this time to contact Dr. Kristen with
these results.

## 2017-10-12 IMAGING — CR DG CHEST 2V
2 series · 2 of 2 positions shown · non-contrast
Comparison: 12/11/2016

CLINICAL DATA: Pneumothorax

EXAM:
CHEST  2 VIEW

[chest pa]
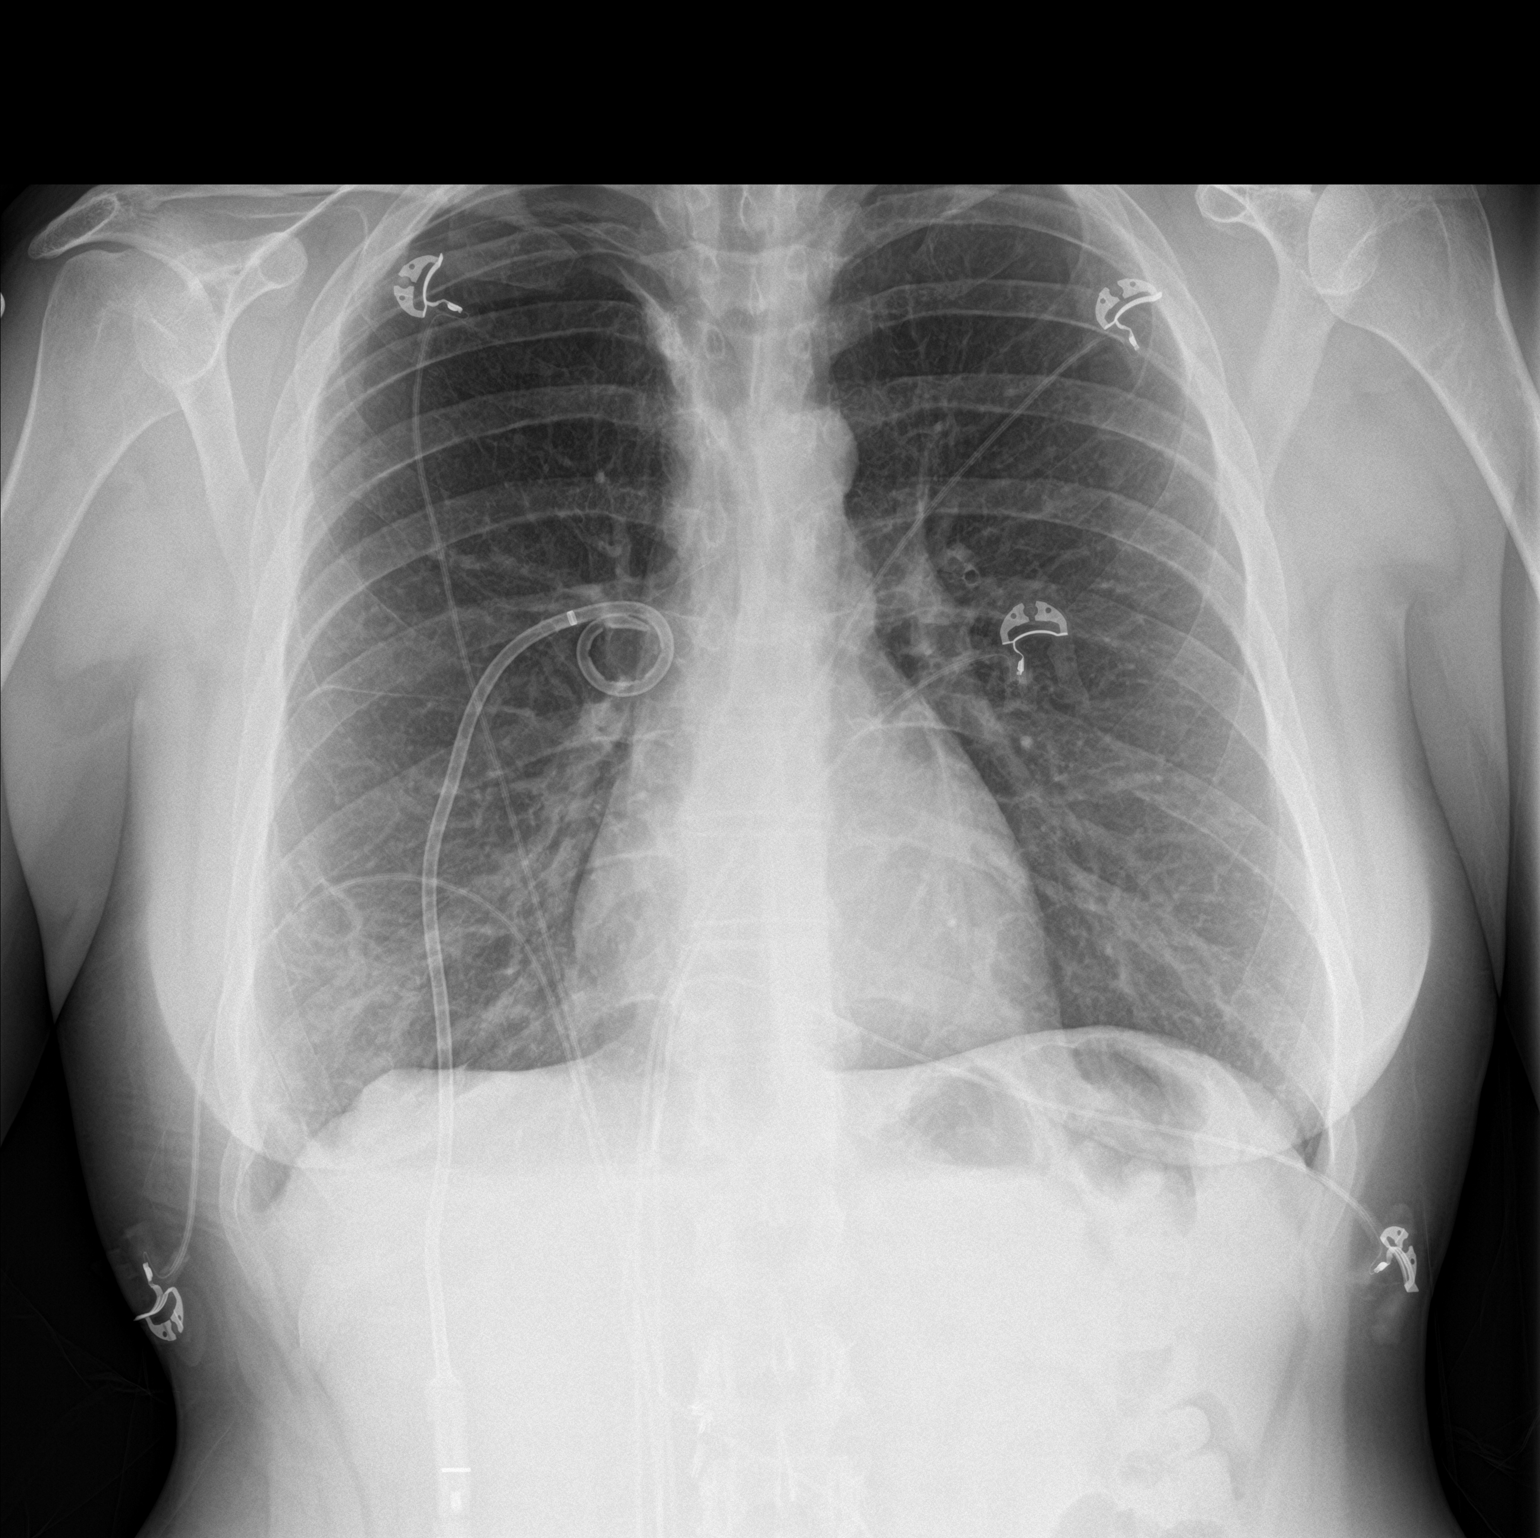

[chest lat]
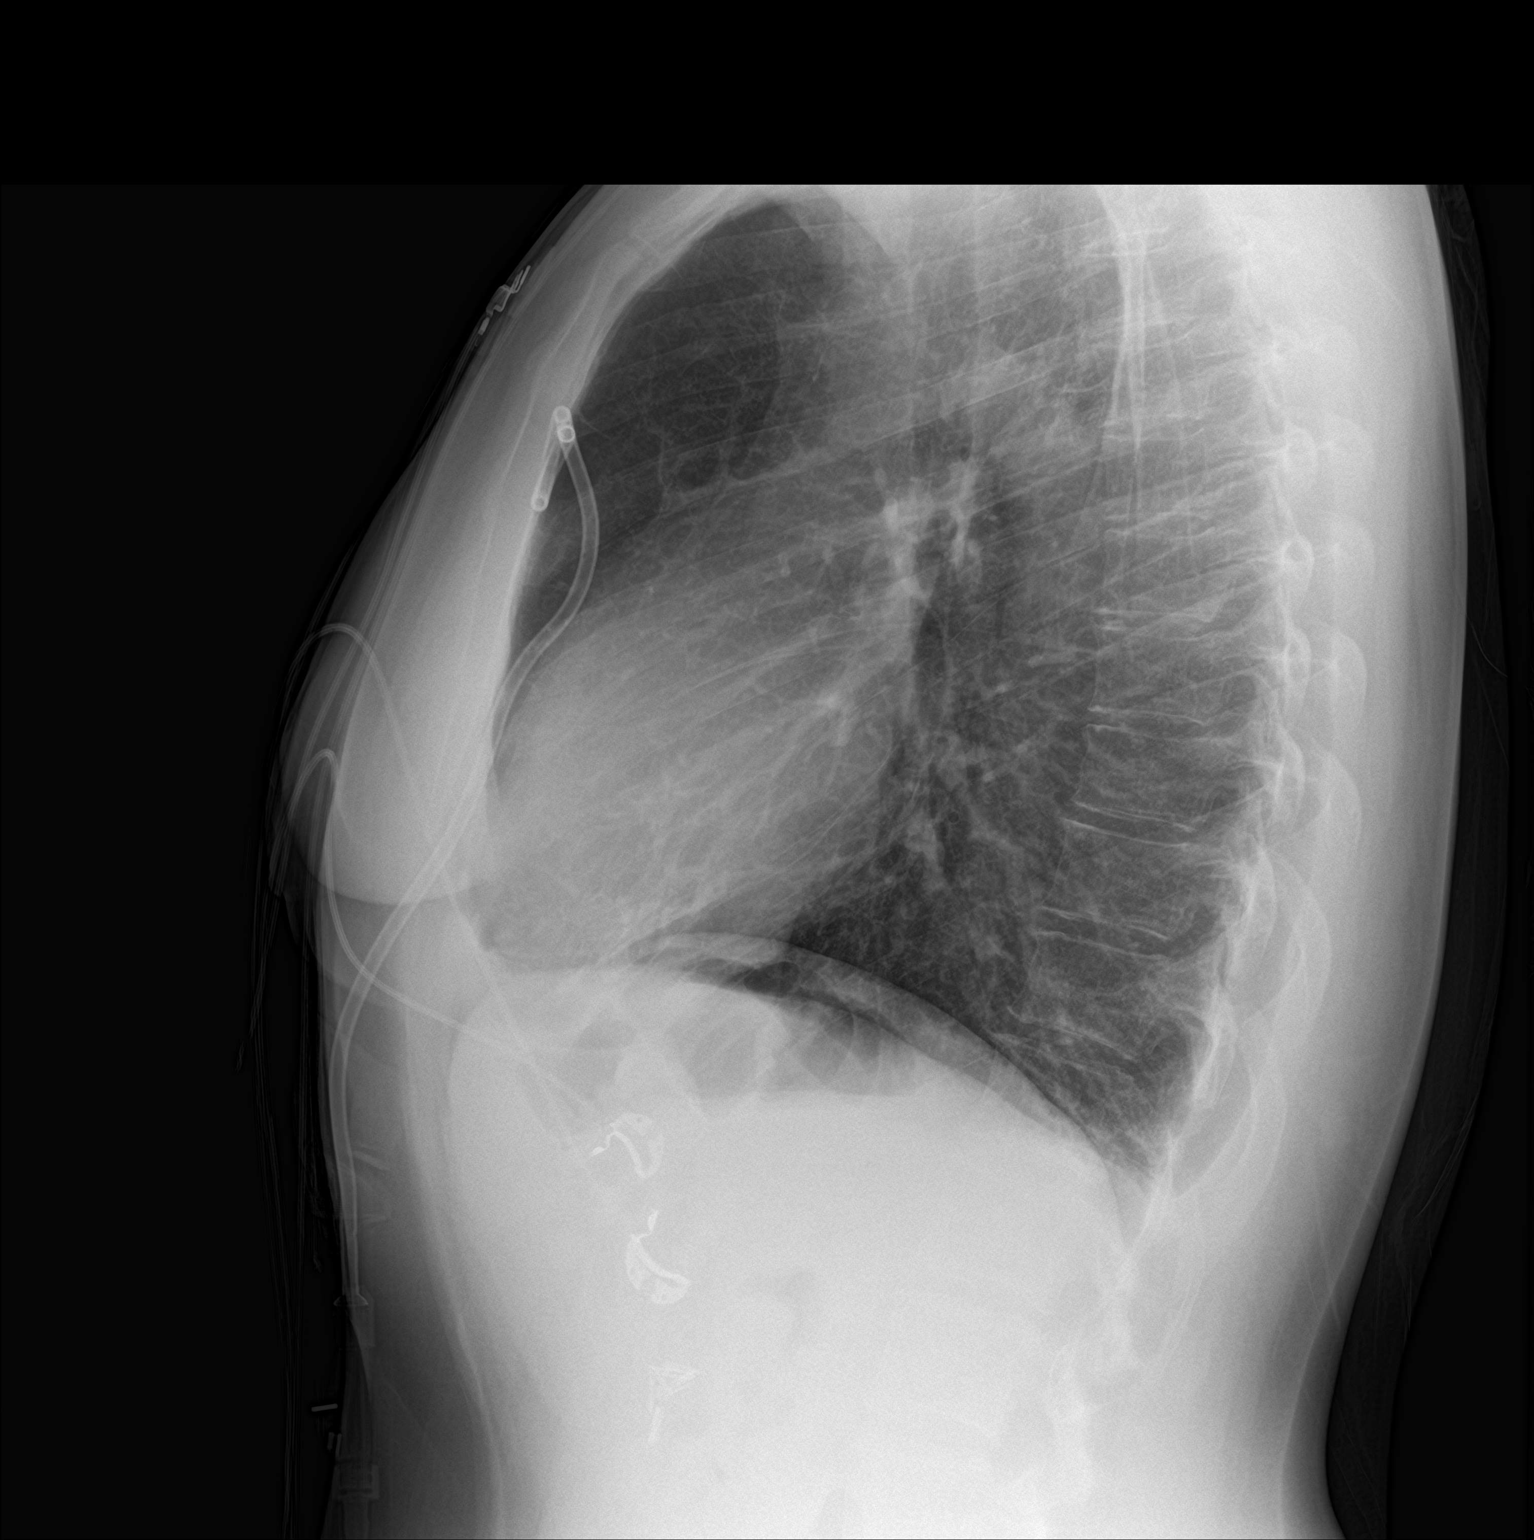

[2 of 2 positions shown; findings below may reference images not displayed]

FINDINGS: Right-sided chest tube in unchanged position. Persistent small right
apical pneumothorax unchanged compared with 12/11/2016. Trace right
pleural effusion. No focal consolidation. No left pleural effusion
or pneumothorax. Stable cardiomediastinal silhouette.

No acute osseous abnormality.
IMPRESSION: Stable, persistent small right apical pneumothorax. Right-sided
chest tube in unchanged position.

## 2018-03-09 ENCOUNTER — Encounter (HOSPITAL_BASED_OUTPATIENT_CLINIC_OR_DEPARTMENT_OTHER): Payer: Self-pay | Admitting: *Deleted

## 2018-03-10 ENCOUNTER — Encounter (HOSPITAL_BASED_OUTPATIENT_CLINIC_OR_DEPARTMENT_OTHER): Payer: Self-pay | Admitting: *Deleted

## 2018-03-10 ENCOUNTER — Other Ambulatory Visit: Payer: Self-pay

## 2018-03-10 NOTE — Progress Notes (Signed)
Spoke with patient via telephone for pre op interview. NPO after MN arrive at 0700. Needs ISTAT 8.

## 2018-03-17 NOTE — H&P (Signed)
Robin Valenzuela is an 37 y.o. female G2P1103 s/p delivery of twins 1/17 by c-section with concurrent tubal. Since then, menses have been erratic.Most recently  every 24-25 days, lasting 7 days. She had a  3 months episode of daily bleeding with 80% moderate bleeding and then spotting rest.  Was sexually active with FOB s/p delivery, but s/p BTL and no recent intercourse. Her SIUS and EMB were WNL. Of note, last year she had 3 spontaneous pneumothoraxes and had to have chest tubes and VATS. Stopped smoking for 6 weeks, now smoking 1/2 ppd.   Pertinent Gynecological History:  OB History: NSVD x 1 term 2014                     C/S x 1 twins 32 weeks 2017   Menstrual History:  Patient's last menstrual period was 02/03/2018 (exact date).    Past Medical History:  Diagnosis Date  . Abnormal Pap smear   . Anxiety   . COPD (chronic obstructive pulmonary disease) (HCC)   . Depression   . 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 Essential hypertension   . History of pneumothorax 11/14/2016   11-17-2016  s/p  right vats w/ pleurodesis and resection of blebs (per last dr surgeon note 07/ 2018 in epic and cxr, pneumothorax resolved)  . Tobacco abuse     Past Surgical History:  Procedure Laterality Date  . CESAREAN SECTION MULTI-GESTATIONAL N/A 09/06/2015   Procedure: CESAREAN SECTION MULTI-GESTATIONAL;  Surgeon: Huel Cote, MD;  Location: [facility];  Service: Obstetrics;  Laterality: N/A;  . CHEST TUBE INSERTION Right 12/08/2016  . CHOLECYSTECTOMY    . CLEFT LIP REPAIR Left   . COLPOSCOPY    . CRYOTHERAPY    . RHINOPLASTY FOR CLEFT LIP / PALATE    . VIDEO ASSISTED THORACOSCOPY (VATS)/WEDGE RESECTION Right 11/17/2016   Procedure: VIDEO ASSISTED THORACOSCOPY (VATS)/STAPLING OF APICAL BLEBS/MECHANICAL PLEURADESIS;  Surgeon: Delight Ovens, MD;  Location: [facility] OR;  Service: Thoracic;  Laterality: Right;  Marland Kitchen VIDEO BRONCHOSCOPY N/A 11/17/2016   Procedure: VIDEO BRONCHOSCOPY;  Surgeon: Delight Ovens, MD;  Location: [facility] OR;  Service: Thoracic;  Laterality: N/A;  . WISDOM TOOTH EXTRACTION      Family History  Problem Relation Age of Onset  . Diabetes Mellitus II Mother   . Hypertension Mother   . Lung cancer Father   . Hypertension Father   . Hepatitis C Father     Social History:  reports that she has been smoking cigarettes.  She has a 7.50 pack-year smoking history. She has never used smokeless tobacco. She reports that she does not drink alcohol or use drugs.  Allergies:  Allergies  Allergen Reactions  . Adhesive [Tape]     Causes skin irritation, when tape is removed her skin comes off too    No medications prior to admission.    Review of Systems  Eyes: Negative for blurred vision.  Respiratory: Negative for wheezing.   Cardiovascular: Negative for chest pain and orthopnea.  Gastrointestinal: Negative for abdominal pain and heartburn.    Height 5\' 9"  (1.753 m), weight 72.6 kg (160 lb), last menstrual period 02/03/2018, not currently breastfeeding. Physical Exam  Constitutional: She appears well-developed.  Cardiovascular: Normal rate and regular rhythm.  Respiratory: Effort normal.  GI: Soft.  Genitourinary: Vagina normal and uterus normal.  Musculoskeletal: Normal range of motion.  Neurological: She is alert.  Psychiatric: She has a normal mood and affect.    No  results found for this or any previous visit (from the past 24 hour(s)).  No results found.  Assessment/Plan: The patient was counseled on the novasure procedure in detail. The risks of bleeding and infection and possible uterine perforation were reviewed. We discussed that the procedure will usually reduce bleeding significantly, but may not eliminate periods. We also discussed that she should not perform this procedure if she desires any future pregnancies. She plans no future childbearing and has had a BTL.  Oliver PilaKathy W Cori Justus 03/17/2018, 6:04 PM

## 2018-03-18 ENCOUNTER — Ambulatory Visit (HOSPITAL_BASED_OUTPATIENT_CLINIC_OR_DEPARTMENT_OTHER): Payer: Medicaid Other | Admitting: Anesthesiology

## 2018-03-18 ENCOUNTER — Encounter (HOSPITAL_BASED_OUTPATIENT_CLINIC_OR_DEPARTMENT_OTHER): Payer: Self-pay

## 2018-03-18 ENCOUNTER — Ambulatory Visit (HOSPITAL_BASED_OUTPATIENT_CLINIC_OR_DEPARTMENT_OTHER)
Admission: RE | Admit: 2018-03-18 | Discharge: 2018-03-18 | Disposition: A | Discharge status: Home / Self Care | Payer: Medicaid Other | Source: Ambulatory Visit | Attending: Obstetrics and Gynecology | Admitting: Obstetrics and Gynecology

## 2018-03-18 ENCOUNTER — Encounter (HOSPITAL_BASED_OUTPATIENT_CLINIC_OR_DEPARTMENT_OTHER)
Admission: RE | Disposition: A | Discharge status: Home / Self Care | Payer: Self-pay | Source: Ambulatory Visit | Attending: Obstetrics and Gynecology

## 2018-03-18 DIAGNOSIS — I10 Essential (primary) hypertension: Secondary | ICD-10-CM | POA: Insufficient documentation

## 2018-03-18 DIAGNOSIS — Z888 Allergy status to other drugs, medicaments and biological substances status: Secondary | ICD-10-CM | POA: Insufficient documentation

## 2018-03-18 DIAGNOSIS — F17210 Nicotine dependence, cigarettes, uncomplicated: Secondary | ICD-10-CM | POA: Diagnosis not present

## 2018-03-18 DIAGNOSIS — N920 Excessive and frequent menstruation with regular cycle: Secondary | ICD-10-CM | POA: Diagnosis not present

## 2018-03-18 DIAGNOSIS — J449 Chronic obstructive pulmonary disease, unspecified: Secondary | ICD-10-CM | POA: Insufficient documentation

## 2018-03-18 DIAGNOSIS — N946 Dysmenorrhea, unspecified: Secondary | ICD-10-CM | POA: Diagnosis present

## 2018-03-18 HISTORY — PX: HYSTEROSCOPY WITH NOVASURE: SHX5574

## 2018-03-18 HISTORY — DX: Personal history of other diseases of the respiratory system: Z87.09

## 2018-03-18 LAB — CBC
HCT: 42.6 % (ref 36.0–46.0)
Hemoglobin: 13.7 g/dL (ref 12.0–15.0)
MCH: 27.8 pg (ref 26.0–34.0)
MCHC: 32.2 g/dL (ref 30.0–36.0)
MCV: 86.6 fL (ref 78.0–100.0)
Platelets: 384 10*3/uL (ref 150–400)
RBC: 4.92 MIL/uL (ref 3.87–5.11)
RDW: 15.1 % (ref 11.5–15.5)
WBC: 6.6 10*3/uL (ref 4.0–10.5)

## 2018-03-18 LAB — BASIC METABOLIC PANEL
Anion gap: 8 (ref 5–15)
BUN: 14 mg/dL (ref 6–20)
CALCIUM: 9.4 mg/dL (ref 8.9–10.3)
CHLORIDE: 107 mmol/L (ref 98–111)
CO2: 25 mmol/L (ref 22–32)
CREATININE: 0.84 mg/dL (ref 0.44–1.00)
GFR calc non Af Amer: >60 mL/min (ref >60)
Glucose, Bld: 87 mg/dL (ref 70–99)
Potassium: 3.9 mmol/L (ref 3.5–5.1)
SODIUM: 140 mmol/L (ref 135–145)

## 2018-03-18 LAB — TYPE AND SCREEN
ABO/RH(D): A POS
ANTIBODY SCREEN: NEGATIVE

## 2018-03-18 LAB — POCT PREGNANCY, URINE: Preg Test, Ur: NEGATIVE

## 2018-03-18 LAB — ABO/RH: ABO/RH(D): A POS

## 2018-03-18 SURGERY — HYSTEROSCOPY WITH NOVASURE
Anesthesia: General | Site: Vagina

## 2018-03-18 MED ORDER — FENTANYL CITRATE (PF) 100 MCG/2ML IJ SOLN
INTRAMUSCULAR | Status: DC | PRN
  Administered 2018-03-18: 50 ug via INTRAVENOUS
  Administered 2018-03-18 (×2): 25 ug via INTRAVENOUS

## 2018-03-18 MED ORDER — ONDANSETRON HCL 4 MG/2ML IJ SOLN
INTRAMUSCULAR | Status: DC | PRN
  Administered 2018-03-18: 4 mg via INTRAVENOUS

## 2018-03-18 MED ORDER — PROPOFOL 10 MG/ML IV BOLUS
INTRAVENOUS | Status: DC | PRN
  Administered 2018-03-18: 200 mg via INTRAVENOUS

## 2018-03-18 MED ORDER — ONDANSETRON HCL 4 MG/2ML IJ SOLN
INTRAMUSCULAR | Status: AC
  Filled 2018-03-18: qty 2

## 2018-03-18 MED ORDER — KETOROLAC TROMETHAMINE 30 MG/ML IJ SOLN
INTRAMUSCULAR | Status: AC
  Filled 2018-03-18: qty 1

## 2018-03-18 MED ORDER — HYDROMORPHONE HCL 1 MG/ML IJ SOLN
0.2500 mg | INTRAMUSCULAR | Status: DC | PRN
  Filled 2018-03-18: qty 0.5

## 2018-03-18 MED ORDER — SODIUM CHLORIDE 0.9 % IR SOLN
Status: DC | PRN
  Administered 2018-03-18: 3000 mL

## 2018-03-18 MED ORDER — DEXAMETHASONE SODIUM PHOSPHATE 10 MG/ML IJ SOLN
INTRAMUSCULAR | Status: AC
  Filled 2018-03-18: qty 1

## 2018-03-18 MED ORDER — HYDROCODONE-ACETAMINOPHEN 5-325 MG PO TABS: 1.0000 | ORAL_TABLET | Freq: Four times a day (QID) | ORAL | 0 refills | Status: AC | PRN

## 2018-03-18 MED ORDER — LIDOCAINE 2% (20 MG/ML) 5 ML SYRINGE
INTRAMUSCULAR | Status: DC | PRN
  Administered 2018-03-18: 60 mg via INTRAVENOUS

## 2018-03-18 MED ORDER — IBUPROFEN 200 MG PO TABS: 600.0000 mg | ORAL_TABLET | Freq: Four times a day (QID) | ORAL | 0 refills | Status: AC | PRN

## 2018-03-18 MED ORDER — FENTANYL CITRATE (PF) 100 MCG/2ML IJ SOLN
INTRAMUSCULAR | Status: AC
  Filled 2018-03-18: qty 4

## 2018-03-18 MED ORDER — KETOROLAC TROMETHAMINE 30 MG/ML IJ SOLN
INTRAMUSCULAR | Status: DC | PRN
  Administered 2018-03-18: 30 mg via INTRAVENOUS

## 2018-03-18 MED ORDER — MIDAZOLAM HCL 2 MG/2ML IJ SOLN
INTRAMUSCULAR | Status: AC
  Filled 2018-03-18: qty 2

## 2018-03-18 MED ORDER — LIDOCAINE HCL 1 % IJ SOLN
INTRAMUSCULAR | Status: AC
  Filled 2018-03-18: qty 20

## 2018-03-18 MED ORDER — LACTATED RINGERS IV SOLN
INTRAVENOUS | Status: DC
  Administered 2018-03-18: 08:00:00 via INTRAVENOUS
  Filled 2018-03-18: qty 1000

## 2018-03-18 MED ORDER — ONDANSETRON 8 MG PO TBDP: 8.0000 mg | ORAL_TABLET | Freq: Three times a day (TID) | ORAL | 0 refills | Status: AC | PRN

## 2018-03-18 MED ORDER — MIDAZOLAM HCL 2 MG/2ML IJ SOLN
INTRAMUSCULAR | Status: DC | PRN
  Administered 2018-03-18: 2 mg via INTRAVENOUS

## 2018-03-18 MED ORDER — LIDOCAINE 2% (20 MG/ML) 5 ML SYRINGE
INTRAMUSCULAR | Status: AC
  Filled 2018-03-18: qty 5

## 2018-03-18 MED ORDER — LIDOCAINE HCL 1 % IJ SOLN
INTRAMUSCULAR | Status: DC | PRN
  Administered 2018-03-18: 20 mL

## 2018-03-18 MED ORDER — HYDROCODONE-ACETAMINOPHEN 5-325 MG PO TABS
1.0000 | ORAL_TABLET | Freq: Once | ORAL | Status: AC
  Administered 2018-03-18: 1 via ORAL
  Filled 2018-03-18: qty 1

## 2018-03-18 MED ORDER — HYDROCODONE-ACETAMINOPHEN 5-325 MG PO TABS
ORAL_TABLET | ORAL | Status: AC
Start: 2018-03-18 — End: ?
  Filled 2018-03-18: qty 1

## 2018-03-18 MED ORDER — DEXAMETHASONE SODIUM PHOSPHATE 10 MG/ML IJ SOLN
INTRAMUSCULAR | Status: DC | PRN
  Administered 2018-03-18: 10 mg via INTRAVENOUS

## 2018-03-18 SURGICAL SUPPLY — 19 items
ABLATOR ENDOMETRIAL BIPOLAR (ABLATOR) IMPLANT
ABLATOR SURESOUND NOVASURE (ABLATOR) ×2 IMPLANT
CANISTER SUCT 3000ML PPV (MISCELLANEOUS) ×2 IMPLANT
CATH ROBINSON RED A/P 16FR (CATHETERS) ×2 IMPLANT
CLOTH BEACON ORANGE TIMEOUT ST (SAFETY) ×2 IMPLANT
CONTAINER PREFILL 10% NBF 60ML (FORM) ×4 IMPLANT
ELECT REM PT RETURN 9FT ADLT (ELECTROSURGICAL)
ELECTRODE REM PT RTRN 9FT ADLT (ELECTROSURGICAL) IMPLANT
GLOVE BIO SURGEON STRL SZ7 (GLOVE) ×2 IMPLANT
GLOVE BIOGEL PI IND STRL 7.0 (GLOVE) ×1 IMPLANT
GLOVE BIOGEL PI INDICATOR 7.0 (GLOVE) ×1
GOWN STRL REUS W/TWL LRG LVL3 (GOWN DISPOSABLE) ×4 IMPLANT
IV LACTATED RINGER IRRG 3000ML (IV SOLUTION) ×1
IV LR IRRIG 3000ML ARTHROMATIC (IV SOLUTION) ×1 IMPLANT
PACK VAGINAL MINOR WOMEN LF (CUSTOM PROCEDURE TRAY) ×2 IMPLANT
PAD OB MATERNITY 4.3X12.25 (PERSONAL CARE ITEMS) ×2 IMPLANT
TOWEL OR 17X24 6PK STRL BLUE (TOWEL DISPOSABLE) ×4 IMPLANT
TUBING AQUILEX INFLOW (TUBING) ×2 IMPLANT
TUBING AQUILEX OUTFLOW (TUBING) ×2 IMPLANT

## 2018-03-18 NOTE — Transfer of Care (Signed)
Immediate Anesthesia Transfer of Care Note  Patient: Robin EvertKrystal L Bechler  Procedure(s) Performed: HYSTEROSCOPY WITH NOVASURE (N/A Vagina )  Patient Location: PACU  Anesthesia Type:General  Level of Consciousness: awake, alert , oriented and patient cooperative  Airway & Oxygen Therapy: Patient Spontanous Breathing and Patient connected to nasal cannula oxygen  Post-op Assessment: Report given to RN and Post -op Vital signs reviewed and stable  Post vital signs: Reviewed and stable  Last Vitals:  Vitals Value Taken Time  BP    Temp    Pulse    Resp    SpO2      Last Pain:  Vitals:   03/18/18 0714  TempSrc:   PainSc: 0-No pain      Patients Stated Pain Goal: 5 (03/18/18 0714)  Complications: No apparent anesthesia complications

## 2018-03-18 NOTE — Progress Notes (Signed)
Not taking BP prescribed BP med "because of the way it makes me feel." Advised to see prescribing physician in order to make any needed changes to regimen.

## 2018-03-18 NOTE — Discharge Instructions (Signed)
DISCHARGE INSTRUCTIONS: HYSTEROSCOPY / ENDOMETRIAL ABLATION The following instructions have been prepared to help you care for yourself upon your return home.  May Remove Scop patch on or before  May take Ibuprofen after after 3:20 PM.  May take stool softner while taking narcotic pain medication to prevent constipation.  Drink plenty of water.  Personal hygiene:  Use sanitary pads for vaginal drainage, not tampons.  Shower the day after your procedure.  NO tub baths, pools or Jacuzzis for 2-3 weeks.  Wipe front to back after using the bathroom.  Activity and limitations:  Do NOT drive or operate any equipment for 24 hours. The effects of anesthesia are still present and drowsiness may result.  Do NOT rest in bed all day.  Walking is encouraged.  Walk up and down stairs slowly.  You may resume your normal activity in one to two days or as indicated by your physician. Sexual activity: NO intercourse for at least 2 weeks after the procedure, or as indicated by your Doctor.  Diet: Eat a light meal as desired this evening. You may resume your usual diet tomorrow.  Return to Work: You may resume your work activities in one to two days or as indicated by Therapist, sportsyour Doctor.  What to expect after your surgery: Expect to have vaginal bleeding/discharge for 2-3 days and spotting for up to 10 days. It is not unusual to have soreness for up to 1-2 weeks. You may have a slight burning sensation when you urinate for the first day. Mild cramps may continue for a couple of days. You may have a regular period in 2-6 weeks.  Call your doctor for any of the following:  Excessive vaginal bleeding or clotting, saturating and changing one pad every hour.  Inability to urinate 6 hours after discharge from hospital.  Pain not relieved by pain medication.  Fever of 100.4 F or greater.  Unusual vaginal discharge or odor.   Post Anesthesia Home Care Instructions  Activity: Get plenty of  rest for the remainder of the day. A responsible individual must stay with you for 24 hours following the procedure.  For the next 24 hours, DO NOT: -Drive a car -Advertising copywriterperate machinery -Drink alcoholic beverages -Take any medication unless instructed by your physician -Make any legal decisions or sign important papers.  Meals: Start with liquid foods such as gelatin or soup. Progress to regular foods as tolerated. Avoid greasy, spicy, heavy foods. If nausea and/or vomiting occur, drink only clear liquids until the nausea and/or vomiting subsides. Call your physician if vomiting continues.  Special Instructions/Symptoms: Your throat may feel dry or sore from the anesthesia or the breathing tube placed in your throat during surgery. If this causes discomfort, gargle with warm salt water. The discomfort should disappear within 24 hours.

## 2018-03-18 NOTE — Op Note (Signed)
Operative Note    Preoperative Diagnosis Dysmenorrhea Menorrhagia  Postoperative Diagnosis same  Procedure Hysteroscopy with Novasure endometrial ablation  Surgeon Huel CoteKathy Nizhoni Parlow, MD  Anesthesia LMA  Fluids: EBL minimal UOP 50ml clear prior to procedure IVF 1200ml  Findings Cavity length 5cm, normal in appearance  Specimen None   Procedure Note Patient was taken to the operating room where LMA anesthesia was obtained without difficulty. She was then prepped and draped in the normal sterile fashion in the dorsal lithotomy position. An appropriate time out was performed. A speculum was then placed within the vagina and the anterior lip of the cervix identified and injected with approximately 2 cc of 1% plain lidocaine. An additional 9 cc each was placed at 2 and 10:00 for a paracervical block. Uterus was then sounded to 8 cm and the cervical length measured at 3.0 cm.  The Pratt dilators utilized to dilate the cervix up to approximately 24. TThe hysteroscope was introduced into the cavity and the findings noted as previously stated.  The hysteroscope was then removed and the Novasure device inserted to the top of the fundus and deployed.  A cavity width of 3.2 noted.  The device was activated with a treatment time of 62 secs. The hysteroscope was then replaced and the cavity noted to have a good treatment effect with blanching and no viable endometrium apparent.  All instruments were removed from the vagina.  The tenaculum site was hemostatic.  Finally the speculum was removed from the vagina and the patient awakened and taken to the recovery room in good condition.

## 2018-03-18 NOTE — Anesthesia Postprocedure Evaluation (Signed)
Anesthesia Post Note  Patient: Robin Valenzuela  Procedure(s) Performed: HYSTEROSCOPY WITH NOVASURE (N/A Vagina )     Patient location during evaluation: PACU Anesthesia Type: General Level of consciousness: awake Pain management: pain level controlled Vital Signs Assessment: post-procedure vital signs reviewed and stable Respiratory status: spontaneous breathing Cardiovascular status: stable Postop Assessment: no headache Anesthetic complications: no    Last Vitals:  Vitals:   03/18/18 1015 03/18/18 1047  BP: (!) 174/85 (!) 171/110  Pulse: (!) 54 (!) 51  Resp: 15 14  Temp:  36.8 C  SpO2: 100% 99%    Last Pain:  Vitals:   03/18/18 1047  TempSrc:   PainSc: 4                  Erby Sanderson

## 2018-03-18 NOTE — Anesthesia Procedure Notes (Signed)
Procedure Name: LMA Insertion Date/Time: 03/18/2018 9:00 AM Performed by: Tyrone NineSauve, Francheska Villeda F, CRNA Pre-anesthesia Checklist: Patient being monitored, Suction available, Emergency Drugs available, Patient identified and Timeout performed Patient Re-evaluated:Patient Re-evaluated prior to induction Oxygen Delivery Method: Circle system utilized Preoxygenation: Pre-oxygenation with 100% oxygen Induction Type: IV induction Ventilation: Mask ventilation without difficulty LMA: LMA inserted LMA Size: 4.0 Number of attempts: 1 Placement Confirmation: breath sounds checked- equal and bilateral,  CO2 detector and positive ETCO2 Tube secured with: Tape Dental Injury: Teeth and Oropharynx as per pre-operative assessment

## 2018-03-18 NOTE — Anesthesia Preprocedure Evaluation (Addendum)
Anesthesia Evaluation  Patient identified by MRN, date of birth, ID band Patient awake    Reviewed: Allergy & Precautions, NPO status , Patient's Chart, lab work & pertinent test results  Airway Mallampati: II  TM Distance: >3 FB     Dental  (+) Dental Advisory Given, Poor Dentition,    Pulmonary COPD, Current Smoker,    breath sounds clear to auscultation       Cardiovascular hypertension,  Rhythm:Regular Rate:Normal     Neuro/Psych    GI/Hepatic negative GI ROS, Neg liver ROS,   Endo/Other  negative endocrine ROS  Renal/GU negative Renal ROS     Musculoskeletal   Abdominal   Peds  Hematology   Anesthesia Other Findings   Reproductive/Obstetrics                            Anesthesia Physical Anesthesia Plan  ASA: III  Anesthesia Plan: General   Post-op Pain Management:    Induction: Intravenous  PONV Risk Score and Plan: Ondansetron, Dexamethasone and Midazolam  Airway Management Planned: LMA  Additional Equipment:   Intra-op Plan:   Post-operative Plan: Extubation in OR  Informed Consent: I have reviewed the patients History and Physical, chart, labs and discussed the procedure including the risks, benefits and alternatives for the proposed anesthesia with the patient or authorized representative who has indicated his/her understanding and acceptance.   Dental advisory given  Plan Discussed with: CRNA and Anesthesiologist  Anesthesia Plan Comments:         Anesthesia Quick Evaluation

## 2018-03-18 NOTE — Progress Notes (Signed)
Patient ID: Robin EvertKrystal L Valenzuela, female   DOB: 11-15-1980, 37 y.o.   MRN: [id number] Per pt no changes in dictated H&P.  Anesthesia aware of her pulmonary history.  Ready to proceed. Reviewed post-operative pain medications with she and her mother.

## 2018-03-19 ENCOUNTER — Encounter (HOSPITAL_BASED_OUTPATIENT_CLINIC_OR_DEPARTMENT_OTHER): Payer: Self-pay | Admitting: Obstetrics and Gynecology

## 2021-05-22 ENCOUNTER — Other Ambulatory Visit: Payer: Self-pay | Admitting: Nurse Practitioner

## 2021-05-22 DIAGNOSIS — R9389 Abnormal findings on diagnostic imaging of other specified body structures: Secondary | ICD-10-CM

## 2021-06-13 ENCOUNTER — Ambulatory Visit
Admission: RE | Admit: 2021-06-13 | Discharge: 2021-06-13 | Disposition: A | Discharge status: Home / Self Care | Payer: Medicaid Other | Source: Ambulatory Visit | Attending: Nurse Practitioner | Admitting: Nurse Practitioner

## 2021-06-13 ENCOUNTER — Other Ambulatory Visit (HOSPITAL_COMMUNITY): Payer: Self-pay | Admitting: Diagnostic Radiology

## 2021-06-13 ENCOUNTER — Other Ambulatory Visit: Payer: Self-pay

## 2021-06-13 DIAGNOSIS — R9389 Abnormal findings on diagnostic imaging of other specified body structures: Secondary | ICD-10-CM

## 2021-06-13 MED ORDER — GADOBUTROL 1 MMOL/ML IV SOLN: 7.0000 mL | Freq: Once | INTRAVENOUS | Status: DC | PRN

## 2022-08-25 ENCOUNTER — Telehealth: Payer: Self-pay | Admitting: Vascular Surgery

## 2022-08-25 NOTE — Telephone Encounter (Signed)
-----  Message from Marty Heck, MD sent at 08/25/2022  5:17 AM EST ----- Can you get this patient an urgent add-on this week?  Had a CTA at [facility] of right upper extremity.  Right hand pain.  Needs right upper extremity arterial duplex.  Any available provider.  Thanks,  Gerald Stabs

## 2022-08-26 ENCOUNTER — Encounter: Payer: Self-pay | Admitting: Vascular Surgery

## 2022-08-26 ENCOUNTER — Ambulatory Visit: Payer: Medicaid Other | Admitting: Vascular Surgery

## 2022-08-26 ENCOUNTER — Ambulatory Visit (HOSPITAL_COMMUNITY)
Admission: RE | Admit: 2022-08-26 | Discharge: 2022-08-26 | Disposition: A | Discharge status: Home / Self Care | Payer: Medicaid Other | Source: Ambulatory Visit | Attending: Vascular Surgery | Admitting: Vascular Surgery

## 2022-08-26 ENCOUNTER — Other Ambulatory Visit (HOSPITAL_COMMUNITY): Payer: Self-pay | Admitting: Vascular Surgery

## 2022-08-26 VITALS — BP 136/80 | HR 125 | Temp 97.4°F | Resp 16 | Ht 69.0 in | Wt 124.0 lb

## 2022-08-26 DIAGNOSIS — I75011 Atheroembolism of right upper extremity: Secondary | ICD-10-CM

## 2022-08-26 DIAGNOSIS — I739 Peripheral vascular disease, unspecified: Secondary | ICD-10-CM

## 2022-08-26 DIAGNOSIS — M79641 Pain in right hand: Secondary | ICD-10-CM | POA: Diagnosis not present

## 2022-08-26 DIAGNOSIS — I75019 Atheroembolism of unspecified upper extremity: Secondary | ICD-10-CM | POA: Insufficient documentation

## 2022-08-26 MED ORDER — "NITROGLYCERIN NICU 2% OINTMENT ": 1.0000 | TOPICAL_OINTMENT | Freq: Two times a day (BID) | TRANSDERMAL | 2 refills | Status: AC

## 2022-08-26 NOTE — Progress Notes (Signed)
Patient name: Robin Valenzuela MRN: [id number] DOB: July 07, 1981 Sex: female  REASON FOR CONSULT: Right hand pain  HPI: Robin Valenzuela is a 42 y.o. female, with hx HTN, COPD, tobacco abuse that presents for evaluation of blood flow to her right hand.  She states on 08/24/2022 she had sudden pain in her right hand and all of her fingertips turned blue.  She was seen at the ED in [place] with a CTA showing no proximal occlusion.  All 5 of her fingertips are discolored in the right hand.  She states her pain is very severe.  Worse when she is outside and its cold.  But this has not gotten any better since it happened 2 days ago.  She was started on Eliquis in the ED with concern for cardioembolic event.  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   Essential hypertension    History of pneumothorax 11/14/2016   11-17-2016  s/p  right vats w/ pleurodesis and resection of blebs (per last dr surgeon note 07/ 2018 in epic and cxr, pneumothorax resolved)   Tobacco abuse     Past Surgical History:  Procedure Laterality Date   CESAREAN SECTION MULTI-GESTATIONAL N/A 09/06/2015   Procedure: CESAREAN SECTION MULTI-GESTATIONAL;  Surgeon: Huel Cote, MD;  Location: [facility];  Service: Obstetrics;  Laterality: N/A;   CHEST TUBE INSERTION Right 12/08/2016   CHOLECYSTECTOMY     CLEFT LIP REPAIR Left    COLPOSCOPY     CRYOTHERAPY     HYSTEROSCOPY WITH NOVASURE N/A 03/18/2018   Procedure: HYSTEROSCOPY WITH NOVASURE;  Surgeon: Huel Cote, MD;  Location: [facility];  Service: Gynecology;  Laterality: N/A;   RHINOPLASTY FOR CLEFT LIP / PALATE     VIDEO ASSISTED THORACOSCOPY (VATS)/WEDGE RESECTION Right 11/17/2016   Procedure: VIDEO ASSISTED THORACOSCOPY (VATS)/STAPLING OF APICAL BLEBS/MECHANICAL PLEURADESIS;  Surgeon: Delight Ovens, MD;   Location: [facility] OR;  Service: Thoracic;  Laterality: Right;   VIDEO BRONCHOSCOPY N/A 11/17/2016   Procedure: VIDEO BRONCHOSCOPY;  Surgeon: Delight Ovens, MD;  Location: [facility] OR;  Service: Thoracic;  Laterality: N/A;   WISDOM TOOTH EXTRACTION      Family History  Problem Relation Age of Onset   Diabetes Mellitus II Mother    Hypertension Mother    Lung cancer Father    Hypertension Father    Hepatitis C Father     SOCIAL HISTORY: Social History   Socioeconomic History   Marital status: Single    Spouse name: Not on file   Number of children: Not on file   Years of education: Not on file   Highest education level: Not on file  Occupational History   Not on file  Tobacco Use   Smoking status: Every Day    Packs/day: 0.50    Years: 15.00    Total pack years: 7.50    Types: Cigarettes    Last attempt to quit: 11/10/2016    Years since quitting: 5.7   Smokeless tobacco: Never   Tobacco comments:    [employer]  Substance and Sexual Activity   Alcohol use: No   Drug use: No   Sexual activity: Yes  Other Topics Concern   Not on file  Social History Narrative   Not on file   Social Determinants of Health  Financial Resource Strain: Not on file  Food Insecurity: Not on file  Transportation Needs: Not on file  Physical Activity: Not on file  Stress: Not on file  Social Connections: Not on file  Intimate Partner Violence: Not on file    Allergies  Allergen Reactions   Adhesive [Tape]     Causes skin irritation, when tape is removed her skin comes off too    Current Outpatient Medications  Medication Sig Dispense Refill   albuterol (PROVENTIL HFA;VENTOLIN HFA) 108 (90 Base) MCG/ACT inhaler Inhale into the lungs every 6 (six) hours as needed for wheezing or shortness of breath.     budesonide-formoterol (SYMBICORT) 160-4.5 MCG/ACT inhaler Inhale 2 puffs into the lungs 2 (two) times daily.     ELIQUIS 5 MG TABS tablet Take 5 mg by mouth 2 (two) times daily.      HYDROcodone-acetaminophen (NORCO) 5-325 MG tablet Take 1 tablet by mouth every 6 (six) hours as needed for moderate pain. 15 tablet 0   ibuprofen (ADVIL) 200 MG tablet Take 3 tablets (600 mg total) by mouth every 6 (six) hours as needed for moderate pain. 30 tablet 0   lisinopril (ZESTRIL) 5 MG tablet Take 5 mg by mouth daily.     montelukast (SINGULAIR) 10 MG tablet Take 10 mg by mouth every morning.     ondansetron (ZOFRAN ODT) 8 MG disintegrating tablet Take 1 tablet (8 mg total) by mouth every 8 (eight) hours as needed for nausea or vomiting. 4 tablet 0   No current facility-administered medications for this visit.    REVIEW OF SYSTEMS:  [X]  denotes positive finding, [ ]  denotes negative finding Cardiac  Comments:  Chest pain or chest pressure:    Shortness of breath upon exertion:    Short of breath when lying flat:    Irregular heart rhythm:        Vascular    Pain in calf, thigh, or hip brought on by ambulation:    Pain in feet at night that wakes you up from your sleep:     Blood clot in your veins:    Leg swelling:         Pulmonary    Oxygen at home:    Productive cough:     Wheezing:         Neurologic    Sudden weakness in arms or legs:     Sudden numbness in arms or legs:     Sudden onset of difficulty speaking or slurred speech:    Temporary loss of vision in one eye:     Problems with dizziness:         Gastrointestinal    Blood in stool:     Vomited blood:         Genitourinary    Burning when urinating:     Blood in urine:        Psychiatric    Major depression:         Hematologic    Bleeding problems:    Problems with blood clotting too easily:        Skin    Rashes or ulcers:        Constitutional    Fever or chills:      PHYSICAL EXAM: Vitals:   08/26/22 1033  BP: 136/80  Pulse: (!) 125  Resp: 16  Temp: (!) 97.4 F (36.3 C)  TempSrc: Temporal  SpO2: 97%  Weight: 124 lb (56.2 kg)  Height: 5\' 9"  (  1.753 m)    GENERAL: The  patient is a well-nourished female, in no acute distress. The vital signs are documented above. CARDIAC: There is a regular rate and rhythm.  VASCULAR:  Right brachial pulse palpable Right radial pulse palpable Righ ulnar signal brisk at the wrist and triphasic Right palmar arch signal Discoloration to all 5 fingertips in the right hand PULMONARY: No respiratory distress. ABDOMEN: Soft and non-tender. MUSCULOSKELETAL: There are no major deformities or cyanosis. NEUROLOGIC: No focal weakness or paresthesias are detected. PSYCHIATRIC: The patient has a normal affect.    DATA:   CLINICAL DATA: Right hand pain and blue fingers x1 day.  EXAM: CT ANGIOGRAPHY UPPER RIGHT EXTREMITY  TECHNIQUE: Thin-section arterial phase axial CT slices were obtained through the right upper extremity, with multiplanar and MIP reconstructions created from the axial data set.  CONTRAST: 80 mL Isovue 370 IV.  COMPARISON: None Available.  FINDINGS: Inflow:  No plaque or dissection in the visualized aortic arch. The innominate and right subclavian artery are normal.  Outflow:  Patent axillary and brachial artery. No flow-limiting stenosis or plaques. Patent interosseous artery which is able to be traced to the wrist.  The radial and ulnar arteries opacify well at least to the wrist. The portion of the deep palmar arch ventral to the index metacarpal base does not opacify and there is suboptimal opacification of the superficial palmar arch as well.  There is flow in the first, index, and fifth palmar metacarpal digital arteries but not in the third and fourth palmar metacarpal digital arteries.  In the digits, there is flow in the ulnar-sided palmar digital artery in the thumb but not in the radial sided artery.  In the index and fifth digits flow is seen in the radial sided palmar digital artery but not in the ulnar-sided artery. There is flow both digital arteries in the long finger at  least proximally and in the ulnar-sided artery in the ring finger.  No arterial flow is seen past the proximal interphalangeal joints in any of the digits, although whether this is due to positioning or primary arterial disease is unknown.  Other: The visualized upper lobes are moderately emphysematous but clear. No soft tissue mass, fluid collections or soft tissue gas are noted in the right upper extremity. There is normal muscle bulk including in the visualized shoulder region.  Review of the MIP images confirms the above findings.  IMPRESSION: 1. No arterial flow is seen past the proximal interphalangeal joints in any of the digits, although whether this is due to positioning or primary arterial disease is unknown. 2. The radial and ulnar arteries are well opacified at least to the wrist. 3. The portion of the deep palmar arch ventral to the index metacarpal base does not opacify well and there is suboptimal opacification of the superficial palmar arch as well. 4. Asymmetries in opacification of the palmar digital arteries. Some do not opacify even in the proximal digits. 5. No soft tissue mass, fluid collection or soft tissue gas in the right upper extremity. 6. Emphysema.  Emphysema (ICD10-J43.9).   Electronically Signed By: Telford Nab M.D. On: 08/25/2022 04:21  Assessment/Plan:  42 year old female with history of COPD and tobacco abuse that presents with acute onset pain in the tips of all 5 of her right hand digits that started on 08/24/2022.  Her pain is severe and she is tearful in the office.  I discussed given the acute nature with severe pain, I do not think this is  Raynaud's.  Very suspicious for cardioembolic event.  Her CTA right upper extremity at [facility] did not show any proximal occlusion in the subclavian, axillary, brachial arteries, or radial ulnar artery.  Her duplex today shows normal triphasic flow all the way down to the palmar arch with no  obstruction.  I have recommended CTA chest and echocardiogram for further evaluation of cardioembolic source.  I asked that she stay on her Eliquis prescribed at the ED.  I have also given her Nitro paste to put on her fingertips.  I will see her back soon as this work-up is complete to discuss next steps.   Cephus Shelling, MD [facility] Office: [phone]

## 2022-08-26 NOTE — Telephone Encounter (Signed)
Appointment schedule for 08/26/2022 [facility]

## 2022-08-29 ENCOUNTER — Telehealth: Payer: Self-pay | Admitting: *Deleted

## 2022-08-29 NOTE — Telephone Encounter (Signed)
Patient called stating she was in need of five dental extractions. She was placed on an antibiotic per patient.  Patient was placed on Eliquis 08/24/2022.  I spoke to [facility] and she suggested if possible to push out the extraction appointment as the Eliquis was only prescribed 08/24/2022.  I explained this to patient  She is to notify the dentist.  Patient voiced understanding of the instructions.

## 2022-09-10 ENCOUNTER — Other Ambulatory Visit: Payer: Self-pay

## 2022-09-10 DIAGNOSIS — I75011 Atheroembolism of right upper extremity: Secondary | ICD-10-CM

## 2022-09-10 DIAGNOSIS — M79641 Pain in right hand: Secondary | ICD-10-CM

## 2022-09-16 ENCOUNTER — Ambulatory Visit: Payer: Medicaid Other | Admitting: Vascular Surgery

## 2022-09-24 ENCOUNTER — Ambulatory Visit (HOSPITAL_COMMUNITY)
Admission: RE | Admit: 2022-09-24 | Discharge: 2022-09-24 | Disposition: A | Discharge status: Home / Self Care | Payer: Medicaid Other | Source: Ambulatory Visit | Attending: Vascular Surgery | Admitting: Vascular Surgery

## 2022-09-24 DIAGNOSIS — J449 Chronic obstructive pulmonary disease, unspecified: Secondary | ICD-10-CM | POA: Diagnosis not present

## 2022-09-24 DIAGNOSIS — I10 Essential (primary) hypertension: Secondary | ICD-10-CM | POA: Insufficient documentation

## 2022-09-24 DIAGNOSIS — I75011 Atheroembolism of right upper extremity: Secondary | ICD-10-CM | POA: Diagnosis not present

## 2022-09-24 DIAGNOSIS — M79641 Pain in right hand: Secondary | ICD-10-CM | POA: Diagnosis not present

## 2022-09-24 DIAGNOSIS — F17200 Nicotine dependence, unspecified, uncomplicated: Secondary | ICD-10-CM | POA: Insufficient documentation

## 2022-09-24 DIAGNOSIS — I3139 Other pericardial effusion (noninflammatory): Secondary | ICD-10-CM | POA: Diagnosis not present

## 2022-09-24 LAB — ECHOCARDIOGRAM COMPLETE
Area-P 1/2: 3.21 cm2
Calc EF: 56 %
S' Lateral: 3.5 cm
Single Plane A2C EF: 61.9 %
Single Plane A4C EF: 56.2 %

## 2022-09-24 NOTE — Progress Notes (Signed)
  Echocardiogram 2D Echocardiogram has been performed.  Robin Valenzuela 09/24/2022, 10:01 AM

## 2022-09-26 ENCOUNTER — Other Ambulatory Visit: Payer: Self-pay | Admitting: Vascular Surgery

## 2022-09-29 ENCOUNTER — Ambulatory Visit (HOSPITAL_COMMUNITY)
Admission: RE | Admit: 2022-09-29 | Discharge: 2022-09-29 | Disposition: A | Discharge status: Home / Self Care | Payer: Medicaid Other | Source: Ambulatory Visit | Attending: Vascular Surgery | Admitting: Vascular Surgery

## 2022-09-29 DIAGNOSIS — M79641 Pain in right hand: Secondary | ICD-10-CM

## 2022-09-29 DIAGNOSIS — I75011 Atheroembolism of right upper extremity: Secondary | ICD-10-CM | POA: Diagnosis present

## 2022-09-29 MED ORDER — IOHEXOL 350 MG/ML SOLN
75.0000 mL | Freq: Once | INTRAVENOUS | Status: AC | PRN
  Administered 2022-09-29: 75 mL via INTRAVENOUS

## 2022-09-30 ENCOUNTER — Ambulatory Visit: Payer: Medicaid Other | Admitting: Vascular Surgery

## 2022-09-30 ENCOUNTER — Encounter: Payer: Self-pay | Admitting: Vascular Surgery

## 2022-09-30 VITALS — BP 110/72 | HR 75 | Temp 98.1°F | Resp 14 | Ht 69.0 in | Wt 130.0 lb

## 2022-09-30 DIAGNOSIS — I75011 Atheroembolism of right upper extremity: Secondary | ICD-10-CM | POA: Diagnosis not present

## 2022-09-30 MED ORDER — ELIQUIS 5 MG PO TABS: 5.0000 mg | ORAL_TABLET | Freq: Two times a day (BID) | ORAL | 1 refills | Status: AC

## 2022-09-30 MED ORDER — ASPIRIN 81 MG PO TBEC: 81.0000 mg | DELAYED_RELEASE_TABLET | Freq: Every day | ORAL | 12 refills | Status: AC

## 2022-09-30 NOTE — Progress Notes (Signed)
Patient name: Robin Valenzuela MRN: [id number] DOB: 1981-03-13 Sex: female  REASON FOR CONSULT: F/u after echo and CTA chest for suspected atheroembolic event right hand   HPI: Robin Valenzuela is a 42 y.o. female, with hx HTN, COPD, tobacco abuse that presents for f/u after echo and CTA chest for evaluation of thromboembolic event to her right hand.  She states on 08/24/2022 she had sudden pain in her right hand and all of her fingertips turned blue.  She was seen at the ED in [place] with a CTA showing no proximal occlusion.  All 5 of her fingertips were discolored in the right hand.  She had severe pain.  She was started on Eliquis in the ED with concern for cardioembolic event.  After evaluation in our office, I sent her for CTA chest as well as echo for cardioembolic workup.  She remains on Eliquis.  Today she states the pain in all 5 of her fingertips in the right hand have completely resolved as well as the discoloration.  She is now pain-free.  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   Essential hypertension    History of pneumothorax 11/14/2016   11-17-2016  s/p  right vats w/ pleurodesis and resection of blebs (per last dr surgeon note 07/ 2018 in epic and cxr, pneumothorax resolved)   Tobacco abuse     Past Surgical History:  Procedure Laterality Date   CESAREAN SECTION MULTI-GESTATIONAL N/A 09/06/2015   Procedure: CESAREAN SECTION MULTI-GESTATIONAL;  Surgeon: Paula Compton, MD;  Location: [facility];  Service: Obstetrics;  Laterality: N/A;   CHEST TUBE INSERTION Right 12/08/2016   CHOLECYSTECTOMY     CLEFT LIP REPAIR Left    COLPOSCOPY     CRYOTHERAPY     HYSTEROSCOPY WITH NOVASURE N/A 03/18/2018   Procedure: HYSTEROSCOPY WITH NOVASURE;  Surgeon: Paula Compton, MD;  Location: [facility];  Service: Gynecology;   Laterality: N/A;   RHINOPLASTY FOR CLEFT LIP / PALATE     VIDEO ASSISTED THORACOSCOPY (VATS)/WEDGE RESECTION Right 11/17/2016   Procedure: VIDEO ASSISTED THORACOSCOPY (VATS)/STAPLING OF APICAL BLEBS/MECHANICAL PLEURADESIS;  Surgeon: Grace Isaac, MD;  Location: [facility];  Service: Thoracic;  Laterality: Right;   VIDEO BRONCHOSCOPY N/A 11/17/2016   Procedure: VIDEO BRONCHOSCOPY;  Surgeon: Grace Isaac, MD;  Location: [facility] OR;  Service: Thoracic;  Laterality: N/A;   WISDOM TOOTH EXTRACTION      Family History  Problem Relation Age of Onset   Diabetes Mellitus II Mother    Hypertension Mother    Lung cancer Father    Hypertension Father    Hepatitis C Father     SOCIAL HISTORY: Social History   Socioeconomic History   Marital status: Single    Spouse name: Not on file   Number of children: Not on file   Years of education: Not on file   Highest education level: Not on file  Occupational History   Not on file  Tobacco Use   Smoking status: Every Day    Packs/day: 0.50    Years: 15.00    Total pack years: 7.50    Types: Cigarettes    Last attempt to quit: 11/10/2016    Years since quitting: 5.8   Smokeless tobacco: Never   Tobacco comments:    ON THE [facility]  Substance and Sexual Activity   Alcohol use: No   Drug use: No   Sexual activity: Yes  Other Topics Concern   Not on file  Social History Narrative   Not on file   Social Determinants of Health   Financial Resource Strain: Not on file  Food Insecurity: Not on file  Transportation Needs: Not on file  Physical Activity: Not on file  Stress: Not on file  Social Connections: Not on file  Intimate Partner Violence: Not on file    Allergies  Allergen Reactions   Adhesive [Tape]     Causes skin irritation, when tape is removed her skin comes off too    Current Outpatient Medications  Medication Sig Dispense Refill   albuterol (PROVENTIL HFA;VENTOLIN HFA) 108 (90 Base) MCG/ACT inhaler Inhale into the lungs  every 6 (six) hours as needed for wheezing or shortness of breath.     budesonide-formoterol (SYMBICORT) 160-4.5 MCG/ACT inhaler Inhale 2 puffs into the lungs 2 (two) times daily.     ELIQUIS 5 MG TABS tablet Take 5 mg by mouth 2 (two) times daily.     lisinopril (ZESTRIL) 5 MG tablet Take 5 mg by mouth daily.     montelukast (SINGULAIR) 10 MG tablet Take 10 mg by mouth every morning.     HYDROcodone-acetaminophen (NORCO) 5-325 MG tablet Take 1 tablet by mouth every 6 (six) hours as needed for moderate pain. (Patient not taking: Reported on 09/30/2022) 15 tablet 0   ibuprofen (ADVIL) 200 MG tablet Take 3 tablets (600 mg total) by mouth every 6 (six) hours as needed for moderate pain. (Patient not taking: Reported on 09/30/2022) 30 tablet 0   nitroGLYCERIN (NITROGLYN) 2 % OINT ointment Apply 1 Application topically every 12 (twelve) hours. (Patient not taking: Reported on 09/30/2022) 1 Tube 2   ondansetron (ZOFRAN ODT) 8 MG disintegrating tablet Take 1 tablet (8 mg total) by mouth every 8 (eight) hours as needed for nausea or vomiting. (Patient not taking: Reported on 09/30/2022) 4 tablet 0   No current facility-administered medications for this visit.    REVIEW OF SYSTEMS:  [X]$  denotes positive finding, [ ]$  denotes negative finding Cardiac  Comments:  Chest pain or chest pressure:    Shortness of breath upon exertion:    Short of breath when lying flat:    Irregular heart rhythm:        Vascular    Pain in calf, thigh, or hip brought on by ambulation:    Pain in feet at night that wakes you up from your sleep:     Blood clot in your veins:    Leg swelling:         Pulmonary    Oxygen at home:    Productive cough:     Wheezing:         Neurologic    Sudden weakness in arms or legs:     Sudden numbness in arms or legs:     Sudden onset of difficulty speaking or slurred speech:    Temporary loss of vision in one eye:     Problems with dizziness:         Gastrointestinal    Blood in  stool:     Vomited blood:         Genitourinary    Burning when urinating:     Blood in urine:        Psychiatric    Major depression:  Hematologic    Bleeding problems:    Problems with blood clotting too easily:        Skin    Rashes or ulcers:        Constitutional    Fever or chills:      PHYSICAL EXAM: Vitals:   09/30/22 0931  BP: 110/72  Pulse: 75  Resp: 14  Temp: 98.1 F (36.7 C)  TempSrc: Temporal  SpO2: (!) 77%  Weight: 130 lb (59 kg)  Height: 5' 9"$  (1.753 m)    GENERAL: The patient is a well-nourished female, in no acute distress. The vital signs are documented above. CARDIAC: There is a regular rate and rhythm.  VASCULAR:  Right brachial pulse palpable Right radial pulse palpable Right palmar arch signal triphasic  Discoloration to all 5 fingertips in the right hand resolved PULMONARY: No respiratory distress. ABDOMEN: Soft and non-tender. MUSCULOSKELETAL: There are no major deformities or cyanosis. NEUROLOGIC: No focal weakness or paresthesias are detected. PSYCHIATRIC: The patient has a normal affect.   DATA:   Echo 09/24/2022 with no evidence of thrombus or other cardiac source for embolus  CTA chest 09/29/22 with no proximal embolic source in the thoracic aorta, innominate, or right subclavian artery  CLINICAL DATA: Right hand pain and blue fingers x1 day.  EXAM: CT ANGIOGRAPHY UPPER RIGHT EXTREMITY  TECHNIQUE: Thin-section arterial phase axial CT slices were obtained through the right upper extremity, with multiplanar and MIP reconstructions created from the axial data set.  CONTRAST: 80 mL Isovue 370 IV.  COMPARISON: None Available.  FINDINGS: Inflow:  No plaque or dissection in the visualized aortic arch. The innominate and right subclavian artery are normal.  Outflow:  Patent axillary and brachial artery. No flow-limiting stenosis or plaques. Patent interosseous artery which is able to be traced to the  wrist.  The radial and ulnar arteries opacify well at least to the wrist. The portion of the deep palmar arch ventral to the index metacarpal base does not opacify and there is suboptimal opacification of the superficial palmar arch as well.  There is flow in the first, index, and fifth palmar metacarpal digital arteries but not in the third and fourth palmar metacarpal digital arteries.  In the digits, there is flow in the ulnar-sided palmar digital artery in the thumb but not in the radial sided artery.  In the index and fifth digits flow is seen in the radial sided palmar digital artery but not in the ulnar-sided artery. There is flow both digital arteries in the long finger at least proximally and in the ulnar-sided artery in the ring finger.  No arterial flow is seen past the proximal interphalangeal joints in any of the digits, although whether this is due to positioning or primary arterial disease is unknown.  Other: The visualized upper lobes are moderately emphysematous but clear. No soft tissue mass, fluid collections or soft tissue gas are noted in the right upper extremity. There is normal muscle bulk including in the visualized shoulder region.  Review of the MIP images confirms the above findings.  IMPRESSION: 1. No arterial flow is seen past the proximal interphalangeal joints in any of the digits, although whether this is due to positioning or primary arterial disease is unknown. 2. The radial and ulnar arteries are well opacified at least to the wrist. 3. The portion of the deep palmar arch ventral to the index metacarpal base does not opacify well and there is suboptimal opacification of the superficial palmar arch as well.  4. Asymmetries in opacification of the palmar digital arteries. Some do not opacify even in the proximal digits. 5. No soft tissue mass, fluid collection or soft tissue gas in the right upper extremity. 6. Emphysema.  Emphysema  (ICD10-J43.9).   Electronically Signed By: Telford Nab M.D. On: 08/25/2022 04:21  Assessment/Plan:  42 year old female with history of COPD and tobacco abuse that presents for follow-up after CTA chest and echo for workup of suspected cardioembolic event.  She was initially seen with discoloration and pain in all 5 over right fingertips that started on 08/24/2022.  She was previously very tearful and crying in the office due to severe pain.  Discussed her echo and CTA chest showed no proximal embolic source.  Previous CTA right upper extremity at [facility] did not show any other source as well.  Today all of her pain is resolved and her fingers are back to normal color.  She has a palpable radial pulse at the wrist.  We discussed her blood thinner and I recommended she do 3 months of Eliquis and then stop this.  I am unclear about this event.  I have also suggested that she start 81 mg aspirin.  I discussed she needs to get very serious about stopping smoking as this could represent some underlying vasculitis like Buerger's disease but again since she had symptom resolution I am very pleased with her progress.  I will see her in 6 months in the [facility] clinic to monitor her progress.   Marty Heck, MD [facility] Office: [phone]

## 2023-03-24 ENCOUNTER — Ambulatory Visit: Payer: Medicaid Other

## 2023-06-04 ENCOUNTER — Other Ambulatory Visit: Payer: Self-pay | Admitting: Family Medicine

## 2023-06-04 DIAGNOSIS — Z1231 Encounter for screening mammogram for malignant neoplasm of breast: Secondary | ICD-10-CM

## 2023-07-02 ENCOUNTER — Ambulatory Visit
Admission: RE | Admit: 2023-07-02 | Discharge: 2023-07-02 | Disposition: A | Discharge status: Home / Self Care | Payer: Medicaid Other | Source: Ambulatory Visit | Attending: Family Medicine | Admitting: Family Medicine

## 2023-07-02 DIAGNOSIS — Z1231 Encounter for screening mammogram for malignant neoplasm of breast: Secondary | ICD-10-CM
